# Patient Record
Sex: Male | Born: 1975 | Race: White | Hispanic: No | Marital: Married | Smoking: Never smoker
Health system: Southern US, Community
[De-identification: ages and names within clinical notes are randomized; demographics above are authoritative.]

## PROBLEM LIST (undated history)

## (undated) DIAGNOSIS — I509 Heart failure, unspecified: Secondary | ICD-10-CM

## (undated) HISTORY — PX: FRACTURE SURGERY: SHX138

---

## 2015-06-20 ENCOUNTER — Emergency Department (HOSPITAL_COMMUNITY)
Admission: EM | Admit: 2015-06-20 | Discharge: 2015-06-20 | Disposition: A | Discharge status: Home / Self Care | Payer: Self-pay | Attending: Emergency Medicine | Admitting: Emergency Medicine

## 2015-06-20 ENCOUNTER — Encounter (HOSPITAL_COMMUNITY): Payer: Self-pay | Admitting: Physical Medicine and Rehabilitation

## 2015-06-20 DIAGNOSIS — I509 Heart failure, unspecified: Secondary | ICD-10-CM | POA: Insufficient documentation

## 2015-06-20 DIAGNOSIS — K529 Noninfective gastroenteritis and colitis, unspecified: Secondary | ICD-10-CM | POA: Insufficient documentation

## 2015-06-20 DIAGNOSIS — Z880 Allergy status to penicillin: Secondary | ICD-10-CM | POA: Insufficient documentation

## 2015-06-20 HISTORY — DX: Heart failure, unspecified: I50.9

## 2015-06-20 LAB — CBC WITH DIFFERENTIAL/PLATELET
Basophils Absolute: 0 10*3/uL (ref 0.0–0.1)
Basophils Relative: 0 %
Eosinophils Absolute: 0 10*3/uL (ref 0.0–0.7)
Eosinophils Relative: 0 %
HCT: 46.7 % (ref 39.0–52.0)
Hemoglobin: 16 g/dL (ref 13.0–17.0)
Lymphocytes Relative: 7 %
Lymphs Abs: 0.6 10*3/uL — ABNORMAL LOW (ref 0.7–4.0)
MCH: 28.4 pg (ref 26.0–34.0)
MCHC: 34.3 g/dL (ref 30.0–36.0)
MCV: 82.8 fL (ref 78.0–100.0)
Monocytes Absolute: 0.3 10*3/uL (ref 0.1–1.0)
Monocytes Relative: 4 %
Neutro Abs: 7.2 10*3/uL (ref 1.7–7.7)
Neutrophils Relative %: 89 %
Platelets: 160 10*3/uL (ref 150–400)
RBC: 5.64 MIL/uL (ref 4.22–5.81)
RDW: 12.9 % (ref 11.5–15.5)
WBC: 8.1 10*3/uL (ref 4.0–10.5)

## 2015-06-20 LAB — COMPREHENSIVE METABOLIC PANEL
ALT: 58 U/L (ref 17–63)
AST: 40 U/L (ref 15–41)
Albumin: 3.9 g/dL (ref 3.5–5.0)
Alkaline Phosphatase: 105 U/L (ref 38–126)
Anion gap: 11 (ref 5–15)
BUN: 16 mg/dL (ref 6–20)
CO2: 27 mmol/L (ref 22–32)
Calcium: 9.2 mg/dL (ref 8.9–10.3)
Chloride: 103 mmol/L (ref 101–111)
Creatinine, Ser: 1.05 mg/dL (ref 0.61–1.24)
GFR calc Af Amer: >60 mL/min (ref >60)
GFR calc non Af Amer: >60 mL/min (ref >60)
Glucose, Bld: 129 mg/dL — ABNORMAL HIGH (ref 65–99)
Potassium: 4.5 mmol/L (ref 3.5–5.1)
Sodium: 141 mmol/L (ref 135–145)
Total Bilirubin: 1.2 mg/dL (ref 0.3–1.2)
Total Protein: 7 g/dL (ref 6.5–8.1)

## 2015-06-20 LAB — URINALYSIS, ROUTINE W REFLEX MICROSCOPIC
Bilirubin Urine: NEGATIVE
Glucose, UA: NEGATIVE mg/dL
Hgb urine dipstick: NEGATIVE
Ketones, ur: NEGATIVE mg/dL
Leukocytes, UA: NEGATIVE
Nitrite: NEGATIVE
Protein, ur: NEGATIVE mg/dL
Specific Gravity, Urine: 1.03 (ref 1.005–1.030)
pH: 6 (ref 5.0–8.0)

## 2015-06-20 MED ORDER — ONDANSETRON HCL 4 MG/2ML IJ SOLN
4.0000 mg | Freq: Once | INTRAMUSCULAR | Status: AC
  Administered 2015-06-20: 4 mg via INTRAVENOUS
  Filled 2015-06-20: qty 2

## 2015-06-20 MED ORDER — PROMETHAZINE HCL 25 MG PO TABS: 25.0000 mg | ORAL_TABLET | Freq: Three times a day (TID) | ORAL | Status: DC | PRN

## 2015-06-20 MED ORDER — SODIUM CHLORIDE 0.9 % IV BOLUS (SEPSIS)
1000.0000 mL | Freq: Once | INTRAVENOUS | Status: AC
  Administered 2015-06-20: 1000 mL via INTRAVENOUS

## 2015-06-20 NOTE — ED Provider Notes (Signed)
CSN: [id number]     Arrival date & time 06/20/15  0803 History   First MD Initiated Contact with Patient 06/20/15 [id number]     Chief Complaint  Patient presents with  . Emesis  . Abdominal Pain     (Consider location/radiation/quality/duration/timing/severity/associated sxs/prior Treatment) HPI Patient presents to the emergency department with nausea, vomiting, diarrhea that started last night.  Patient states that it started after he ate chicken patient states he did not take any medications prior to arrival.  Nothing seems make his condition, better or worse.  The patient states he is not have any chest pain, back pain, dysuria, shortness breath, incontinence, weakness, dizziness, blurred vision, headache, cough, fever, near syncope or syncope.  The patient states that nothing seems make his condition, better or worse Past Medical History  Diagnosis Date  . CHF (congestive heart failure) (HCC)    History reviewed. No pertinent past surgical history. No family history on file. Social History  Substance Use Topics  . Smoking status: Never Smoker   . Smokeless tobacco: None  . Alcohol Use: No    Review of Systems  All other systems negative except as documented in the HPI. All pertinent positives and negatives as reviewed in the HPI.=  Allergies  Penicillins and Tramadol  Home Medications   Prior to Admission medications   Not on File   BP 128/83 mmHg  Pulse 84  Temp(Src) 99.6 F (37.6 C) (Oral)  Resp 18  Ht 5\' 11"  (1.803 m)  Wt 90.719 kg  BMI 27.91 kg/m2  SpO2 97% Physical Exam  Constitutional: He is oriented to person, place, and time. He appears well-developed and well-nourished. No distress.  HENT:  Head: Normocephalic and atraumatic.  Mouth/Throat: Oropharynx is clear and moist.  Eyes: Pupils are equal, round, and reactive to light.  Neck: Normal range of motion. Neck supple.  Cardiovascular: Normal rate, regular rhythm and normal heart sounds.  Exam reveals no  gallop and no friction rub.   No murmur heard. Pulmonary/Chest: Effort normal and breath sounds normal. No respiratory distress. He has no wheezes.  Abdominal: Soft. Bowel sounds are normal. He exhibits no distension. There is no tenderness.  Neurological: He is alert and oriented to person, place, and time. He exhibits normal muscle tone. Coordination normal.  Skin: Skin is warm and dry. No rash noted. No erythema.  Psychiatric: He has a normal mood and affect. His behavior is normal.  Nursing note and vitals reviewed.   ED Course  Procedures (including critical care time) Labs Review Labs Reviewed  COMPREHENSIVE METABOLIC PANEL - Abnormal; Notable for the following:    Glucose, Bld 129 (*)    All other components within normal limits  CBC WITH DIFFERENTIAL/PLATELET - Abnormal; Notable for the following:    Lymphs Abs 0.6 (*)    All other components within normal limits  URINALYSIS, ROUTINE W REFLEX MICROSCOPIC (NOT AT [facility])    Imaging Review No results found. I have personally reviewed and evaluated these images and lab results as part of my medical decision-making.  Patient be discharged home with a gastroenteritis diagnosis patient is advised return here as needed.  Told to increase his fluid intake, rest as much possible.  Patient tolerated oral fluids here in the emergency department    Charlestine Nighthristopher Gillis Boardley, PA-C 06/20/15 1307  Pricilla LovelessScott Goldston, MD 06/21/15 [phone]

## 2015-06-20 NOTE — Discharge Instructions (Signed)
Return here as needed.  Increase her fluid intake.  Follow up with a primary care doctor °

## 2015-06-20 NOTE — ED Notes (Signed)
Pt presents to department via [facility] for evaluation of diffuse abdominal pain and nausea/vomiting. Onset Sunday evening after eating chicken. Pt states "I think I have food poisoning."

## 2015-07-05 ENCOUNTER — Emergency Department (HOSPITAL_COMMUNITY): Payer: Self-pay

## 2015-07-05 ENCOUNTER — Emergency Department (HOSPITAL_COMMUNITY)
Admission: EM | Admit: 2015-07-05 | Discharge: 2015-07-05 | Disposition: A | Discharge status: Home / Self Care | Payer: Self-pay | Attending: Emergency Medicine | Admitting: Emergency Medicine

## 2015-07-05 ENCOUNTER — Encounter (HOSPITAL_COMMUNITY): Payer: Self-pay

## 2015-07-05 DIAGNOSIS — Z880 Allergy status to penicillin: Secondary | ICD-10-CM | POA: Insufficient documentation

## 2015-07-05 DIAGNOSIS — R05 Cough: Secondary | ICD-10-CM

## 2015-07-05 DIAGNOSIS — J329 Chronic sinusitis, unspecified: Secondary | ICD-10-CM

## 2015-07-05 DIAGNOSIS — R059 Cough, unspecified: Secondary | ICD-10-CM

## 2015-07-05 DIAGNOSIS — J019 Acute sinusitis, unspecified: Secondary | ICD-10-CM | POA: Insufficient documentation

## 2015-07-05 DIAGNOSIS — I509 Heart failure, unspecified: Secondary | ICD-10-CM | POA: Insufficient documentation

## 2015-07-05 LAB — CBC
HEMATOCRIT: 42.7 % (ref 39.0–52.0)
HEMOGLOBIN: 14.1 g/dL (ref 13.0–17.0)
MCH: 27.5 pg (ref 26.0–34.0)
MCHC: 33 g/dL (ref 30.0–36.0)
MCV: 83.2 fL (ref 78.0–100.0)
PLATELETS: 242 10*3/uL (ref 150–400)
RBC: 5.13 MIL/uL (ref 4.22–5.81)
RDW: 13 % (ref 11.5–15.5)
WBC: 6.9 10*3/uL (ref 4.0–10.5)

## 2015-07-05 LAB — COMPREHENSIVE METABOLIC PANEL
ALT: 31 U/L (ref 17–63)
AST: 38 U/L (ref 15–41)
Albumin: 3.7 g/dL (ref 3.5–5.0)
Alkaline Phosphatase: 81 U/L (ref 38–126)
Anion gap: 7 (ref 5–15)
BUN: 9 mg/dL (ref 6–20)
CHLORIDE: 102 mmol/L (ref 101–111)
CO2: 27 mmol/L (ref 22–32)
CREATININE: 1.08 mg/dL (ref 0.61–1.24)
Calcium: 8.9 mg/dL (ref 8.9–10.3)
GFR calc Af Amer: >60 mL/min (ref >60)
GFR calc non Af Amer: >60 mL/min (ref >60)
Glucose, Bld: 122 mg/dL — ABNORMAL HIGH (ref 65–99)
POTASSIUM: 4 mmol/L (ref 3.5–5.1)
SODIUM: 136 mmol/L (ref 135–145)
Total Bilirubin: 0.4 mg/dL (ref 0.3–1.2)
Total Protein: 7.1 g/dL (ref 6.5–8.1)

## 2015-07-05 LAB — I-STAT CG4 LACTIC ACID, ED: Lactic Acid, Venous: 1.33 mmol/L (ref 0.5–2.0)

## 2015-07-05 MED ORDER — BENZONATATE 100 MG PO CAPS
100.0000 mg | ORAL_CAPSULE | Freq: Once | ORAL | Status: AC
  Administered 2015-07-05: 100 mg via ORAL
  Filled 2015-07-05: qty 1

## 2015-07-05 MED ORDER — DEXAMETHASONE 2 MG PO TABS: 10.0000 mg | ORAL_TABLET | Freq: Once | ORAL | Status: DC

## 2015-07-05 MED ORDER — BENZONATATE 100 MG PO CAPS: 100.0000 mg | ORAL_CAPSULE | Freq: Three times a day (TID) | ORAL | Status: DC | PRN

## 2015-07-05 MED ORDER — DOXYCYCLINE HYCLATE 100 MG PO CAPS: 100.0000 mg | ORAL_CAPSULE | Freq: Two times a day (BID) | ORAL | Status: DC

## 2015-07-05 MED ORDER — ONDANSETRON 4 MG PO TBDP
4.0000 mg | ORAL_TABLET | Freq: Once | ORAL | Status: AC
  Administered 2015-07-05: 4 mg via ORAL

## 2015-07-05 MED ORDER — DEXAMETHASONE 4 MG PO TABS
10.0000 mg | ORAL_TABLET | Freq: Once | ORAL | Status: AC
Start: 2015-07-05 — End: 2015-07-05
  Administered 2015-07-05: 10 mg via ORAL
  Filled 2015-07-05: qty 3

## 2015-07-05 MED ORDER — ONDANSETRON 4 MG PO TBDP
ORAL_TABLET | ORAL | Status: AC
  Filled 2015-07-05: qty 1

## 2015-07-05 NOTE — ED Notes (Signed)
Pt approached registration and stated he was vomiting and needed something for nausea.

## 2015-07-05 NOTE — ED Provider Notes (Signed)
CSN: [id number]     Arrival date & time 07/05/15  1826 History   First MD Initiated Contact with Patient 07/05/15 2241     Chief Complaint  Patient presents with  . Cough     (Consider location/radiation/quality/duration/timing/severity/associated sxs/prior Treatment) HPI Comments: 40 year old male recently released from prison presents with his significant other for concern for cough and fever. The patient reports that over the last few days he has had nasal and sinus congestion as well as a cough. He reports that the cough has been nonproductive. He reports that today was the first day he developed a fever and his significant other says that it was up to 103F. The patient reports that he lives in a group home/halfway house right now and that multiple others at the facility have had similar symptoms. The patient's significant other states that she did give him Tylenol for his fever with only mild improvement. He denies chest pain. No rash. No nausea or vomiting. No diarrhea. 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   Past Medical History  Diagnosis Date  . CHF (congestive heart failure) (HCC)    History reviewed. No pertinent past surgical history. No family history on file. Social History  Substance Use Topics  . Smoking status: Never Smoker   . Smokeless tobacco: None  . Alcohol Use: No    Review of Systems  Constitutional: Positive for fever, chills and fatigue. Negative for appetite change.  HENT: Positive for congestion, postnasal drip, rhinorrhea and sinus pressure.   Eyes: Negative for redness.  Respiratory: Positive for cough. Negative for chest tightness and shortness of breath.   Cardiovascular: Negative for chest pain, palpitations and leg swelling.  Gastrointestinal: Negative for nausea, vomiting, abdominal pain and diarrhea.  Genitourinary: Negative for dysuria, urgency and frequency.  Musculoskeletal: Positive for myalgias. Negative  for back pain.  Skin: Negative for rash.  Neurological: Negative for dizziness, weakness and headaches.  Hematological: Does not bruise/bleed easily.      Allergies  Penicillins and Tramadol  Home Medications   Prior to Admission medications   Medication Sig Start Date End Date Taking? Authorizing Provider  benzonatate (TESSALON) 100 MG capsule Take 1 capsule (100 mg total) by mouth 3 (three) times daily as needed for cough. 07/05/15   Leta Baptist, MD  dexamethasone (DECADRON) 2 MG tablet Take 5 tablets (10 mg total) by mouth once. Take as a single dose on 07/07/15 in the morning after breakfast 07/05/15   Leta Baptist, MD  doxycycline (VIBRAMYCIN) 100 MG capsule Take 1 capsule (100 mg total) by mouth 2 (two) times daily. 07/05/15   Leta Baptist, MD  promethazine (PHENERGAN) 25 MG tablet Take 1 tablet (25 mg total) by mouth every 8 (eight) hours as needed for nausea or vomiting. 06/20/15   Christopher Lawyer, PA-C   BP 130/83 mmHg  Pulse 97  Temp(Src) 99.3 F (37.4 C) (Oral)  Resp 18  Ht  (1.803 m)  Wt 195 lb (88.451 kg)  BMI 27.21 kg/m2  SpO2 98% Physical Exam  Constitutional: He is oriented to person, place, and time. He appears well-developed and well-nourished. No distress.  HENT:  Head: Normocephalic and atraumatic.  Right Ear: Tympanic membrane and external ear normal.  Left Ear: Tympanic membrane and external ear normal.  Nose: Right sinus exhibits maxillary sinus tenderness and frontal sinus tenderness. Left sinus exhibits maxillary sinus tenderness and frontal sinus tenderness.  Mouth/Throat: Oropharynx is clear and  moist. No oropharyngeal exudate, posterior oropharyngeal edema, posterior oropharyngeal erythema or tonsillar abscesses.  Postnasal drip noted over the posterior pharynx. Poor transillumination of the sinuses.  Eyes: EOM are normal. Pupils are equal, round, and reactive to light.  Neck: Normal range of motion. Neck supple.  Cardiovascular:  Normal rate, regular rhythm, normal heart sounds and intact distal pulses.   No murmur heard. Pulmonary/Chest: Effort normal. No respiratory distress. He has no wheezes. He has no rales.  Abdominal: Soft. He exhibits no distension. There is no tenderness.  Musculoskeletal: He exhibits no edema.  Neurological: He is alert and oriented to person, place, and time.  Skin: Skin is warm and dry. No rash noted. He is not diaphoretic.  Vitals reviewed.   ED Course  Procedures (including critical care time) Labs Review Labs Reviewed  COMPREHENSIVE METABOLIC PANEL - Abnormal; Notable for the following:    Glucose, Bld 122 (*)    All other components within normal limits  RAPID STREP SCREEN (NOT AT [facility])  CULTURE, GROUP A STREP [facility])  CBC  I-STAT CG4 LACTIC ACID, ED    Imaging Review No results found. I have personally reviewed and evaluated these images and lab results as part of my medical decision-making.   EKG Interpretation None      MDM  Patient was seen and evaluated in stable condition. Unremarkable respiratory examination. Laboratory studies unremarkable. Physical examination. Most consistent with viral URI versus sinusitis. Discussed at length with patient and his significant other at bedside that antibiotics are not recommended for sinusitis and less symptoms last seen 7 or greater days. Patient was given Decadron and Tessalon Perle for symptom control. Patient and significant other in agreement with plan for discharge. Patient was discharged home in stable condition with a prescription for [facility] as well as a prescription for doxycycline for sinusitis to only be filled if symptoms persist for 4 or more days after presentation today. All questions were answered prior to discharge. Final diagnoses:  Cough  Sinusitis, unspecified chronicity, unspecified location    1. Sinusitis 2. Upper respiratory viral infection    Leta Baptist, MD 07/08/15 [phone]

## 2015-07-05 NOTE — Discharge Instructions (Signed)
You were seen and evaluated today for your cough. It appears that you have a sinus infection. Likely the cough and sinus infection are viral. If your symptoms persist for another 3 days he can fill the antibiotic to treat the sinus infection. Use the cough medicine as prescribed. Take the second dose of steroid not tomorrow morning but the next morning after breakfast. Follow up outpatient with the clinic provided to establish primary care and for follow-up.  Cough, Adult Coughing is a reflex that clears your throat and your airways. Coughing helps to heal and protect your lungs. It is normal to cough occasionally, but a cough that happens with other symptoms or lasts a long time may be a sign of a condition that needs treatment. A cough may last only 2-3 weeks (acute), or it may last longer than 8 weeks (chronic). CAUSES Coughing is commonly caused by:  Breathing in substances that irritate your lungs.  A viral or bacterial respiratory infection.  Allergies.  Asthma.  Postnasal drip.  Smoking.  Acid backing up from the stomach into the esophagus (gastroesophageal reflux).  Certain medicines.  Chronic lung problems, including COPD (or rarely, lung cancer).  Other medical conditions such as heart failure. HOME CARE INSTRUCTIONS  Pay attention to any changes in your symptoms. Take these actions to help with your discomfort:  Take medicines only as told by your health care provider.  If you were prescribed an antibiotic medicine, take it as told by your health care provider. Do not stop taking the antibiotic even if you start to feel better.  Talk with your health care provider before you take a cough suppressant medicine.  Drink enough fluid to keep your urine clear or pale yellow.  If the air is dry, use a cold steam vaporizer or humidifier in your bedroom or your home to help loosen secretions.  Avoid anything that causes you to cough at work or at home.  If your cough is  worse at night, try sleeping in a semi-upright position.  Avoid cigarette smoke. If you smoke, quit smoking. If you need help quitting, ask your health care provider.  Avoid caffeine.  Avoid alcohol.  Rest as needed. SEEK MEDICAL CARE IF:   You have new symptoms.  You cough up pus.  Your cough does not get better after 2-3 weeks, or your cough gets worse.  You cannot control your cough with suppressant medicines and you are losing sleep.  You develop pain that is getting worse or pain that is not controlled with pain medicines.  You have a fever.  You have unexplained weight loss.  You have night sweats. SEEK IMMEDIATE MEDICAL CARE IF:  You cough up blood.  You have difficulty breathing.  Your heartbeat is very fast.   This information is not intended to replace advice given to you by your health care provider. Make sure you discuss any questions you have with your health care provider.   Document Released: 12/01/2010 Document Revised: 02/23/2015 Document Reviewed: 08/11/2014 Elsevier Interactive Patient Education 2016 Else[employer] Inc[employer]  Sinusitis, Adult Sinusitis is redness, soreness, and inflammation of the paranasal sinuses. Paranasal sinuses are air pockets within the bones of your face. They are located beneath your eyes, in the middle of your forehead, and above your eyes. In healthy paranasal sinuses, mucus is able to drain out, and air is able to circulate through them by way of your nose. However, when your paranasal sinuses are inflamed, mucus and air can become trapped. This  can allow bacteria and other germs to grow and cause infection. Sinusitis can develop quickly and last only a short time (acute) or continue over a long period (chronic). Sinusitis that lasts for more than 12 weeks is considered chronic. CAUSES Causes of sinusitis include:  Allergies.  Structural abnormalities, such as displacement of the cartilage that separates your nostrils (deviated  septum), which can decrease the air flow through your nose and sinuses and affect sinus drainage.  Functional abnormalities, such as when the small hairs (cilia) that line your sinuses and help remove mucus do not work properly or are not present. SIGNS AND SYMPTOMS Symptoms of acute and chronic sinusitis are the same. The primary symptoms are pain and pressure around the affected sinuses. Other symptoms include:  Upper toothache.  Earache.  Headache.  Bad breath.  Decreased sense of smell and taste.  A cough, which worsens when you are lying flat.  Fatigue.  Fever.  Thick drainage from your nose, which often is green and may contain pus (purulent).  Swelling and warmth over the affected sinuses. DIAGNOSIS Your health care provider will perform a physical exam. During your exam, your health care provider may perform any of the following to help determine if you have acute sinusitis or chronic sinusitis:  Look in your nose for signs of abnormal growths in your nostrils (nasal polyps).  Tap over the affected sinus to check for signs of infection.  View the inside of your sinuses using an imaging device that has a light attached (endoscope). If your health care provider suspects that you have chronic sinusitis, one or more of the following tests may be recommended:  Allergy tests.  Nasal culture. A sample of mucus is taken from your nose, sent to a lab, and screened for bacteria.  Nasal cytology. A sample of mucus is taken from your nose and examined by your health care provider to determine if your sinusitis is related to an allergy. TREATMENT Most cases of acute sinusitis are related to a viral infection and will resolve on their own within 10 days. Sometimes, medicines are prescribed to help relieve symptoms of both acute and chronic sinusitis. These may include pain medicines, decongestants, nasal steroid sprays, or saline sprays. However, for sinusitis related to a  bacterial infection, your health care provider will prescribe antibiotic medicines. These are medicines that will help kill the bacteria causing the infection. Rarely, sinusitis is caused by a fungal infection. In these cases, your health care provider will prescribe antifungal medicine. For some cases of chronic sinusitis, surgery is needed. Generally, these are cases in which sinusitis recurs more than 3 times per year, despite other treatments. HOME CARE INSTRUCTIONS  Drink plenty of water. Water helps thin the mucus so your sinuses can drain more easily.  Use a humidifier.  Inhale steam 3-4 times a day (for example, sit in the bathroom with the shower running).  Apply a warm, moist washcloth to your face 3-4 times a day, or as directed by your health care provider.  Use saline nasal sprays to help moisten and clean your sinuses.  Take medicines only as directed by your health care provider.  If you were prescribed either an antibiotic or antifungal medicine, finish it all even if you start to feel better. SEEK IMMEDIATE MEDICAL CARE IF:  You have increasing pain or severe headaches.  You have nausea, vomiting, or drowsiness.  You have swelling around your face.  You have vision problems.  You have a stiff neck.  You have difficulty breathing.   This information is not intended to replace advice given to you by your health care provider. Make sure you discuss any questions you have with your health care provider.   Document Released: 06/04/2005 Document Revised: 06/25/2014 Document Reviewed: 06/19/2011 Elsevier Interactive Patient Education [employer]

## 2015-07-05 NOTE — ED Notes (Signed)
Pt left with all his belongings and ambulated out of the treatment area.  

## 2015-07-05 NOTE — ED Notes (Signed)
Pt brought in via [facility] for dry cough, fever of 103 and chills; onset 3 days ago. Wife reports he took tylenol today and fever decreased to 102. She also gave him multiple OTC meds. Temp with EMS 99 and temp here 99.3. Wife states she called EMS because his cough is not going away.

## 2015-07-06 LAB — RAPID STREP SCREEN (MED CTR MEBANE ONLY): Streptococcus, Group A Screen (Direct): NEGATIVE

## 2015-07-08 LAB — CULTURE, GROUP A STREP (THRC)

## 2015-08-03 ENCOUNTER — Emergency Department (HOSPITAL_COMMUNITY)
Admission: EM | Admit: 2015-08-03 | Discharge: 2015-08-03 | Disposition: A | Discharge status: Home / Self Care | Payer: Self-pay | Attending: Emergency Medicine | Admitting: Emergency Medicine

## 2015-08-03 ENCOUNTER — Encounter (HOSPITAL_COMMUNITY): Payer: Self-pay | Admitting: *Deleted

## 2015-08-03 DIAGNOSIS — R590 Localized enlarged lymph nodes: Secondary | ICD-10-CM | POA: Insufficient documentation

## 2015-08-03 DIAGNOSIS — K029 Dental caries, unspecified: Secondary | ICD-10-CM

## 2015-08-03 DIAGNOSIS — Z792 Long term (current) use of antibiotics: Secondary | ICD-10-CM | POA: Insufficient documentation

## 2015-08-03 DIAGNOSIS — Z880 Allergy status to penicillin: Secondary | ICD-10-CM | POA: Insufficient documentation

## 2015-08-03 DIAGNOSIS — I509 Heart failure, unspecified: Secondary | ICD-10-CM | POA: Insufficient documentation

## 2015-08-03 MED ORDER — CLINDAMYCIN HCL 150 MG PO CAPS: 150.0000 mg | ORAL_CAPSULE | Freq: Four times a day (QID) | ORAL | Status: DC

## 2015-08-03 MED ORDER — CLINDAMYCIN HCL 150 MG PO CAPS
300.0000 mg | ORAL_CAPSULE | Freq: Once | ORAL | Status: AC
  Administered 2015-08-03: 300 mg via ORAL
  Filled 2015-08-03: qty 2

## 2015-08-03 MED ORDER — IBUPROFEN 400 MG PO TABS
800.0000 mg | ORAL_TABLET | Freq: Once | ORAL | Status: AC
  Administered 2015-08-03: 800 mg via ORAL
  Filled 2015-08-03: qty 2

## 2015-08-03 MED ORDER — IBUPROFEN 800 MG PO TABS: 800.0000 mg | ORAL_TABLET | Freq: Three times a day (TID) | ORAL | Status: DC

## 2015-08-03 NOTE — Discharge Instructions (Signed)
Please call and follow up with a dentist in the next 2 days for further management of your dental pain  Dental Caries Dental caries (also called tooth decay) is the most common oral disease. It can occur at any age but is more common in children and young adults.  HOW DENTAL CARIES DEVELOPS  The process of decay begins when bacteria and foods (particularly sugars and starches) combine in your mouth to produce plaque. Plaque is a substance that sticks to the hard, outer surface of a tooth (enamel). The bacteria in plaque produce acids that attack enamel. These acids may also attack the root surface of a tooth (cementum) if it is exposed. Repeated attacks dissolve these surfaces and create holes in the tooth (cavities). If left untreated, the acids destroy the other layers of the tooth.  RISK FACTORS  Frequent sipping of sugary beverages.   Frequent snacking on sugary and starchy foods, especially those that easily get stuck in the teeth.   Poor oral hygiene.   Dry mouth.   Substance abuse such as methamphetamine abuse.   Broken or poor-fitting dental restorations.   Eating disorders.   Gastroesophageal reflux disease (GERD).   Certain radiation treatments to the head and neck. SYMPTOMS In the early stages of dental caries, symptoms are seldom present. Sometimes white, chalky areas may be seen on the enamel or other tooth layers. In later stages, symptoms may include:  Pits and holes on the enamel.  Toothache after sweet, hot, or cold foods or drinks are consumed.  Pain around the tooth.  Swelling around the tooth. DIAGNOSIS  Most of the time, dental caries is detected during a regular dental checkup. A diagnosis is made after a thorough medical and dental history is taken and the surfaces of your teeth are checked for signs of dental caries. Sometimes special instruments, such as lasers, are used to check for dental caries. Dental X-ray exams may be taken so that areas not  visible to the eye (such as between the contact areas of the teeth) can be checked for cavities.  TREATMENT  If dental caries is in its early stages, it may be reversed with a fluoride treatment or an application of a remineralizing agent at the dental office. Thorough brushing and flossing at home is needed to aid these treatments. If it is in its later stages, treatment depends on the location and extent of tooth destruction:   If a small area of the tooth has been destroyed, the destroyed area will be removed and cavities will be filled with a material such as gold, silver amalgam, or composite resin.   If a large area of the tooth has been destroyed, the destroyed area will be removed and a cap (crown) will be fitted over the remaining tooth structure.   If the center part of the tooth (pulp) is affected, a procedure called a root canal will be needed before a filling or crown can be placed.   If most of the tooth has been destroyed, the tooth may need to be pulled (extracted). HOME CARE INSTRUCTIONS You can prevent, stop, or reverse dental caries at home by practicing good oral hygiene. Good oral hygiene includes:  Thoroughly cleaning your teeth at least twice a day with a toothbrush and dental floss.   Using a fluoride toothpaste. A fluoride mouth rinse may also be used if recommended by your dentist or health care provider.   Restricting the amount of sugary and starchy foods and sugary liquids you  consume.   Avoiding frequent snacking on these foods and sipping of these liquids.   Keeping regular visits with a dentist for checkups and cleanings. PREVENTION   Practice good oral hygiene.  Consider a dental sealant. A dental sealant is a coating material that is applied by your dentist to the pits and grooves of teeth. The sealant prevents food from being trapped in them. It may protect the teeth for several years.  Ask about fluoride supplements if you live in a community  without fluorinated water or with water that has a low fluoride content. Use fluoride supplements as directed by your dentist or health care provider.  Allow fluoride varnish applications to teeth if directed by your dentist or health care provider.   This information is not intended to replace advice given to you by your health care provider. Make sure you discuss any questions you have with your health care provider.   Document Released: 02/24/2002 Document Revised: 06/25/2014 Document Reviewed: 06/06/2012 Elsevier Interactive Patient Education [employer].

## 2015-08-03 NOTE — ED Notes (Signed)
Declined W/C at D/C and was escorted to lobby by RN. 

## 2015-08-03 NOTE — ED Provider Notes (Signed)
CSN: [id number]     Arrival date & time 08/03/15  1812 History  By signing my name below, I, Martin Lin, attest that this documentation has been prepared under the direction and in the presence of [name], PA-C Electronically Signed: Soijett Lin, ED Scribe. 08/03/2015. 6:31 PM.   Chief Complaint  Patient presents with  . Dental Pain      The history is provided by the patient. No language interpreter was used.    Martin Lin is a 40 y.o. male  who presents to the Emergency Department complaining of constant, throbbing, right sided upper back dental pain onset last night worsening today. He notes that he has three teeth that have holes in them and he doesn't have a dentist at this time. Pt is working on obtaining his orange card at this time. He states that his dental pain is worsened with temperature change and there are no alleviating factors. He reports that he has had trouble sleeping due to the dental pain. He states that has not tried any medications for the relief for his symptoms. He denies fever and any other symptoms. Pt is allergic to penicillin and he is unsure of his reaction. Pt is allergic to tramadol to which he has red spots.     Past Medical History  Diagnosis Date  . CHF (congestive heart failure) (HCC)    No past surgical history on file. No family history on file. Social History  Substance Use Topics  . Smoking status: Never Smoker   . Smokeless tobacco: Not on file  . Alcohol Use: No    Review of Systems  Constitutional: Negative for fever and chills.  HENT: Positive for dental problem. Negative for facial swelling and trouble swallowing.       Allergies  Penicillins and Tramadol  Home Medications   Prior to Admission medications   Medication Sig Start Date End Date Taking? Authorizing Provider  benzonatate (TESSALON) 100 MG capsule Take 1 capsule (100 mg total) by mouth 3 (three) times daily as needed for cough. 07/05/15   Leta Baptist, MD   dexamethasone (DECADRON) 2 MG tablet Take 5 tablets (10 mg total) by mouth once. Take as a single dose on 07/07/15 in the morning after breakfast 07/05/15   Leta Baptist, MD  doxycycline (VIBRAMYCIN) 100 MG capsule Take 1 capsule (100 mg total) by mouth 2 (two) times daily. 07/05/15   Leta Baptist, MD  promethazine (PHENERGAN) 25 MG tablet Take 1 tablet (25 mg total) by mouth every 8 (eight) hours as needed for nausea or vomiting. 06/20/15   Charlestine Night, PA-C   There were no vitals taken for this visit. Physical Exam Physical Exam  Constitutional: Pt appears well-developed and well-nourished.  HENT:  Head: Normocephalic.  Right Ear: Tympanic membrane, external ear and ear canal normal.  Left Ear: Tympanic membrane, external ear and ear canal normal.  Nose: Nose normal. Right sinus exhibits no maxillary sinus tenderness and no frontal sinus tenderness. Left sinus exhibits no maxillary sinus tenderness and no frontal sinus tenderness.  Mouth/Throat: Uvula is midline, oropharynx is clear and moist and mucous membranes are normal. No oral lesions. Abnormal dentition. Significant dental decay noted to tooth #2, #3, #7, #8, and #9  Right upper gum line. Mild erythema. No abscess noted. Dental caries present. No uvula swelling or lacerations. No oropharyngeal exudate, posterior oropharyngeal edema, posterior oropharyngeal erythema or tonsillar abscesses.  No gingival swelling, fluctuance or induration No gross abscess  Eyes: Conjunctivae  are normal. Pupils are equal, round, and reactive to light. Right eye exhibits no discharge. Left eye exhibits no discharge.  Neck: Normal range of motion. Neck supple.  No stridor Handling secretions without difficulty No nuchal rigidity Anterior cervical lymphadenopathy that is TTP   Cardiovascular: Normal rate, regular rhythm and normal heart sounds.   Pulmonary/Chest: Effort normal. No respiratory distress.  Equal chest rise  Abdominal: Soft.  Bowel sounds are normal. Pt exhibits no distension. There is no tenderness.  Lymphadenopathy:    Pt has no cervical adenopathy.  Neurological: Pt is alert.  Skin: Skin is warm and dry.  Psychiatric: Pt has a normal mood and affect.  Nursing note and vitals reviewed.   ED Course  Procedures (including critical care time) DIAGNOSTIC STUDIES: Oxygen Saturation is 99% on RA, nl by my interpretation.    COORDINATION OF CARE: 6:28 PM Discussed treatment plan with pt at bedside which includes ibuprofen, abx Rx, anti-inflammatory Rx and pt agreed to plan.   MDM   Final diagnoses:  Pain due to dental caries    Patient with dentalgia.  No abscess requiring immediate incision and drainage.  Exam not concerning for Ludwig's angina or pharyngeal abscess.  Will treat with ibuprofen Rx and abx Rx. Pt instructed to follow-up with dentist.  Discussed return precautions. Pt safe for discharge.  I personally performed the services described in this documentation, which was scribed in my presence. The recorded information has been reviewed and is accurate.   BP 136/74 mmHg  Pulse 81  Temp(Src) 98.2 F (36.8 C) (Oral)  Resp 18  Ht  (1.803 m)  Wt 90.719 kg  BMI 27.91 kg/m2  SpO2 99%    [name], PA-C 08/04/15 0129  Glynn Octave, MD 08/04/15 [phone]

## 2015-08-03 NOTE — ED Notes (Signed)
Pt reports dental pain started this AM

## 2015-09-01 ENCOUNTER — Emergency Department (HOSPITAL_COMMUNITY)
Admission: EM | Admit: 2015-09-01 | Discharge: 2015-09-02 | Disposition: A | Discharge status: Home / Self Care | Payer: Self-pay | Attending: Emergency Medicine | Admitting: Emergency Medicine

## 2015-09-01 ENCOUNTER — Encounter (HOSPITAL_COMMUNITY): Payer: Self-pay

## 2015-09-01 DIAGNOSIS — R443 Hallucinations, unspecified: Secondary | ICD-10-CM

## 2015-09-01 DIAGNOSIS — I509 Heart failure, unspecified: Secondary | ICD-10-CM | POA: Insufficient documentation

## 2015-09-01 DIAGNOSIS — R441 Visual hallucinations: Secondary | ICD-10-CM | POA: Insufficient documentation

## 2015-09-01 DIAGNOSIS — Z880 Allergy status to penicillin: Secondary | ICD-10-CM | POA: Insufficient documentation

## 2015-09-01 LAB — COMPREHENSIVE METABOLIC PANEL
ALT: 57 U/L (ref 17–63)
AST: 55 U/L — ABNORMAL HIGH (ref 15–41)
Albumin: 4 g/dL (ref 3.5–5.0)
Alkaline Phosphatase: 81 U/L (ref 38–126)
Anion gap: 9 (ref 5–15)
BILIRUBIN TOTAL: 0.4 mg/dL (ref 0.3–1.2)
BUN: 7 mg/dL (ref 6–20)
CHLORIDE: 106 mmol/L (ref 101–111)
CO2: 26 mmol/L (ref 22–32)
CREATININE: 0.99 mg/dL (ref 0.61–1.24)
Calcium: 9.5 mg/dL (ref 8.9–10.3)
GFR calc Af Amer: >60 mL/min (ref >60)
GLUCOSE: 87 mg/dL (ref 65–99)
Potassium: 4.3 mmol/L (ref 3.5–5.1)
Sodium: 141 mmol/L (ref 135–145)
Total Protein: 7 g/dL (ref 6.5–8.1)

## 2015-09-01 LAB — CBC
HEMATOCRIT: 44.4 % (ref 39.0–52.0)
HEMOGLOBIN: 14.5 g/dL (ref 13.0–17.0)
MCH: 27.7 pg (ref 26.0–34.0)
MCHC: 32.7 g/dL (ref 30.0–36.0)
MCV: 84.7 fL (ref 78.0–100.0)
Platelets: 257 10*3/uL (ref 150–400)
RBC: 5.24 MIL/uL (ref 4.22–5.81)
RDW: 13.5 % (ref 11.5–15.5)
WBC: 8.1 10*3/uL (ref 4.0–10.5)

## 2015-09-01 LAB — RAPID URINE DRUG SCREEN, HOSP PERFORMED
Amphetamines: NOT DETECTED
BARBITURATES: NOT DETECTED
Benzodiazepines: NOT DETECTED
COCAINE: NOT DETECTED
Opiates: NOT DETECTED
TETRAHYDROCANNABINOL: NOT DETECTED

## 2015-09-01 LAB — ETHANOL

## 2015-09-01 NOTE — ED Provider Notes (Signed)
CSN: [id number]     Arrival date & time 09/01/15  1650 History   First MD Initiated Contact with Patient 09/01/15 2140     Chief Complaint  Patient presents with  . Hallucinations      HPI  40 y.o. male presenting with chief complaint of visual hallucinations. Patient is accompanied by his fiance who corroborates his story. The patient reports that he worked double shifts last week and as a consequence did not sleep for over 4 days straight. About 3 days into not sleeping, patient reports "seeing people out of the corner of my eye in the shadows." Denies hearing voices. Denies suicidal ideation, homicidal ideation, history of depression, prior psychiatric diagnoses, or psychotropic medications. He denies drug or alcohol use. Denies recent head trauma. Denies recent illnesses including headache, fever, neck stiffness, chest pain, shortness of breath, abdominal pain, nausea, vomiting. He reports that after sleeping well 2 nights in a row his symptoms have completely resolved.   Past Medical History  Diagnosis Date  . CHF (congestive heart failure) (HCC)    History reviewed. No pertinent past surgical history. No family history on file. Social History  Substance Use Topics  . Smoking status: Never Smoker   . Smokeless tobacco: None  . Alcohol Use: No    Review of Systems  Constitutional: Negative for fever, chills, activity change and appetite change.  HENT: Negative for congestion, facial swelling, rhinorrhea and sore throat.   Eyes: Negative for visual disturbance.  Respiratory: Negative for cough, shortness of breath and wheezing.   Cardiovascular: Negative for chest pain, palpitations and leg swelling.  Gastrointestinal: Negative for nausea, vomiting, abdominal pain, diarrhea, constipation and blood in stool.  Genitourinary: Negative for dysuria, frequency, hematuria, flank pain and difficulty urinating.  Musculoskeletal: Negative for myalgias, back pain, joint swelling,  arthralgias, neck pain and neck stiffness.  Skin: Negative for rash.  Neurological: Negative for dizziness, weakness, light-headedness and headaches.  Psychiatric/Behavioral: Positive for hallucinations (resolved) and decreased concentration (resolved). Negative for suicidal ideas, behavioral problems, confusion and agitation.      Allergies  Penicillins and Tramadol  Home Medications   Prior to Admission medications   Medication Sig Start Date End Date Taking? Authorizing Provider  benzonatate (TESSALON) 100 MG capsule Take 1 capsule (100 mg total) by mouth 3 (three) times daily as needed for cough. Patient not taking: Reported on 09/01/2015 07/05/15   Leta Baptist, MD  clindamycin (CLEOCIN) 150 MG capsule Take 1 capsule (150 mg total) by mouth every 6 (six) hours. Patient not taking: Reported on 09/01/2015 08/03/15   [name], PA-C  dexamethasone (DECADRON) 2 MG tablet Take 5 tablets (10 mg total) by mouth once. Take as a single dose on 07/07/15 in the morning after breakfast Patient not taking: Reported on 09/01/2015 07/05/15   Leta Baptist, MD  doxycycline (VIBRAMYCIN) 100 MG capsule Take 1 capsule (100 mg total) by mouth 2 (two) times daily. Patient not taking: Reported on 09/01/2015 07/05/15   Leta Baptist, MD  ibuprofen (ADVIL,MOTRIN) 800 MG tablet Take 1 tablet (800 mg total) by mouth 3 (three) times daily. Patient not taking: Reported on 09/01/2015 08/03/15   [name], PA-C  promethazine (PHENERGAN) 25 MG tablet Take 1 tablet (25 mg total) by mouth every 8 (eight) hours as needed for nausea or vomiting. Patient not taking: Reported on 09/01/2015 06/20/15   Charlestine Night, PA-C   BP 136/75 mmHg  Pulse 66  Temp(Src) 98.3 F (36.8 C) (Oral)  Resp 19  SpO2 98% Physical Exam  Constitutional: He is oriented to person, place, and time. He appears well-developed and well-nourished. No distress.  HENT:  Head: Normocephalic and atraumatic.  Right Ear: External ear normal.   Left Ear: External ear normal.  Nose: Nose normal.  Mouth/Throat: Oropharynx is clear and moist. No oropharyngeal exudate.  Eyes: Conjunctivae are normal. Pupils are equal, round, and reactive to light. Right eye exhibits no discharge. Left eye exhibits no discharge. No scleral icterus.  Neck: Normal range of motion. Neck supple. No tracheal deviation present.  Cardiovascular: Normal rate, regular rhythm and normal heart sounds.  Exam reveals no gallop and no friction rub.   No murmur heard. Pulmonary/Chest: Effort normal and breath sounds normal. No respiratory distress. He has no wheezes. He has no rales.  Abdominal: Soft. Bowel sounds are normal. He exhibits no distension and no mass. There is no tenderness. There is no rebound and no guarding.  Musculoskeletal: Normal range of motion. He exhibits no edema or tenderness.  Neurological: He is alert and oriented to person, place, and time. He exhibits normal muscle tone.  5/5 strength in the upper and lower extremities bilaterally, with intact sensation to light touch.   Skin: Skin is warm and dry. No rash noted. He is not diaphoretic.  Psychiatric: He has a normal mood and affect. His behavior is normal. Judgment and thought content normal.    ED Course  Procedures (including critical care time) Labs Review Labs Reviewed  COMPREHENSIVE METABOLIC PANEL - Abnormal; Notable for the following:    AST 55 (*)    All other components within normal limits  ETHANOL  CBC  URINE RAPID DRUG SCREEN, HOSP PERFORMED    Imaging Review No results found. I have personally reviewed and evaluated these images and lab results as part of my medical decision-making.   EKG Interpretation None      MDM   Final diagnoses:  Hallucinations    Patient is generally well-appearing. Alert and oriented 4. Appropriate behavior and mood. He reports that his hallucinations have completely resolved after sleeping. Doubt organic etiology for his symptoms  as he denies infectious symptoms and there is no history of recent head trauma. Doubt new onset psychiatric diagnosis as sleep deprivation was a clear preceeding trigger for his symptoms. Patient continues to deny suicidal or homicidal ideation. He is able to contract for safety and is not a danger to himself or others. I counseled him to not skip sleep and to return to the emergency department immediately for any suicidal or homicidal ideation. Furthermore, he should follow up here for recurrence of his symptoms in the absence of sleep deprivation. He expressed agreement and understanding with this plan and will follow up as appropriate.    Jenifer Ernestina PennaBrunno Irick, MD 09/02/15 [id number]  Azalia BilisKevin Campos, MD 09/02/15 [phone]

## 2015-09-01 NOTE — ED Notes (Signed)
Pt here requesting a mental health evaluation because he was having visual hallucinations on Saturday, 'I was seeing people I knew didn't exist." He denies SI/HI or current hallucinations but states 'my mind is just not right and I want to see what is wrong." pt also denies illegal drug or alcohol use.

## 2015-09-23 ENCOUNTER — Ambulatory Visit (HOSPITAL_COMMUNITY)
Admission: EM | Admit: 2015-09-23 | Discharge: 2015-09-23 | Disposition: A | Discharge status: Home / Self Care | Payer: No Typology Code available for payment source | Attending: Emergency Medicine | Admitting: Emergency Medicine

## 2015-09-23 ENCOUNTER — Encounter (HOSPITAL_COMMUNITY): Payer: Self-pay | Admitting: Emergency Medicine

## 2015-09-23 DIAGNOSIS — R1110 Vomiting, unspecified: Secondary | ICD-10-CM

## 2015-09-23 DIAGNOSIS — R197 Diarrhea, unspecified: Secondary | ICD-10-CM

## 2015-09-23 MED ORDER — ONDANSETRON 4 MG PO TBDP
8.0000 mg | ORAL_TABLET | Freq: Once | ORAL | Status: AC
  Administered 2015-09-23: 8 mg via ORAL

## 2015-09-23 MED ORDER — ONDANSETRON 4 MG PO TBDP
ORAL_TABLET | ORAL | Status: AC
  Filled 2015-09-23: qty 2

## 2015-09-23 MED ORDER — ONDANSETRON HCL 4 MG PO TABS: ORAL_TABLET | ORAL | Status: DC

## 2015-09-23 NOTE — ED Provider Notes (Signed)
HPI  SUBJECTIVE:  Martin Lin is a 40 y.o. male who presents with multiple episodes of nonbilious, nonbloody vomiting, and watery, nonbloody diarrhea starting last night at 2000. Estimates that he has had "over 20" episodes of vomiting and diarrhea. Reports crampy abdominal pain before vomiting and diarrhea, which then resolves. He has no other abdominal pain. He states that he is unable to tolerate by mouth. He has tried Alka-Seltzer, Pepto-Bismol, symptoms are better with Alka-Seltzer, no aggravating factors. He denies nausea, headache, chest pain, shortness of breath. No other abdominal pain, abdominal distention. No anorexia. Patient states that he is urinating normally. No dizziness, syncope. He states that he feels somewhat weak. Patient has no urinary complaints. He denies taking any medications whatsoever. He states he had identical symptoms with food poisoning several months ago. He denies any raw or undercooked foods, no known sick contacts. No smoking, drinking, illegal drugs, diabetes, hypertension, abdominal surgeries, gallbladder disease, pancreatitis kidney disease. Has past medical history of CHF. PMD: None.     Past Medical History  Diagnosis Date  . CHF (congestive heart failure) [facility])     Past Surgical History  Procedure Laterality Date  . Fracture surgery      History reviewed. No pertinent family history.  Social History  Substance Use Topics  . Smoking status: Never Smoker   . Smokeless tobacco: None  . Alcohol Use: No    No current facility-administered medications for this encounter.  Current outpatient prescriptions:  .  ondansetron (ZOFRAN) 4 MG tablet, Take 1-2 tablets 3 times a day as needed for nausea, vomiting, Disp: 30 tablet, Rfl: 0 .  [DISCONTINUED] promethazine (PHENERGAN) 25 MG tablet, Take 1 tablet (25 mg total) by mouth every 8 (eight) hours as needed for nausea or vomiting. (Patient not taking: Reported on 09/01/2015), Disp: 15 tablet, Rfl:  0  Allergies  Allergen Reactions  . Penicillins Other (See Comments)    Childhood Has patient had a PCN reaction causing immediate rash, facial/tongue/throat swelling, SOB or lightheadedness with hypotension: YES Has patient had a PCN reaction causing severe rash involving mucus membranes or skin necrosis: NO Has patient had a PCN reaction that required hospitalization NO Has patient had a PCN reaction occurring within the last 10 years: NO If all of the above answers are "NO", then may proceed with Cephalosporin use.   . Tramadol Rash     ROS  As noted in HPI.   Physical Exam  BP 134/77 mmHg  Pulse 73  Temp(Src) 99 F (37.2 C) (Oral)  Resp 16  Ht  (1.803 m)  Wt 200 lb (90.719 kg)  BMI 27.91 kg/m2  SpO2 100%  Orthostatic VS for the past 24 hrs:  BP- Lying Pulse- Lying BP- Sitting Pulse- Sitting BP- Standing at 0 minutes Pulse- Standing at 0 minutes  09/23/15 1702 144/75 mmHg 65 121/69 mmHg 76 120/79 mmHg 80      Constitutional: Well developed, well nourished, no acute distress Eyes: PERRL, EOMI, conjunctiva normal bilaterally HENT: Normocephalic, atraumatic,mucus membranes moist Respiratory: Clear to auscultation bilaterally, no rales, no wheezing, no rhonchi Cardiovascular: Normal rate and rhythm, no murmurs, no gallops, no rubs. Refill less than 2 seconds. GI: Soft, nondistended, normal bowel sounds, nontender, no rebound, no guarding skin: skin intact. Good skin turgor Musculoskeletal: No edema, no tenderness, no deformities Neurologic: Alert & oriented x 3, CN II-XII grossly intact, no motor deficits, sensation grossly intact Psychiatric: Speech and behavior appropriate  ED Course   Medications  ondansetron (ZOFRAN-ODT) disintegrating  tablet 8 mg (8 mg Oral Given 09/23/15 1700)    Orders Placed This Encounter  Procedures  . Orthostatic vital signs    Standing Status: Standing     Number of Occurrences: 1     Standing Expiration Date:    No  results found for this or any previous visit (from the past 24 hour(s)). No results found.  ED Clinical Impression  Vomiting and diarrhea  ED Assessment/Plan  Patient is mildly orthostatic, but was asymptomatic when orthostatics were performed. He appears well-hydrated. Refill is less than 2 seconds. Abdomen is benign. Doubt surgical abdomen. Patient has no history of kidney disease, and is urinating normally per patient. We will attempt by mouth rehydration. Deferring labs today.   On reEvaluation, patient's tolerating by mouth. He has no complaints. Home with Zofran, Pedialyte, push fluids, bland diet as tolerated. Will provide primary care referral.   Discussed MDM, plan and followup with patient  Discussed sn/sx that should prompt return to the  ED. Patient  agrees with plan.  *This clinic note was created using Dragon dictation software. Therefore, there may be occasional mistakes despite careful proofreading.  ?  Domenick GongAshley Gerald Kuehl, MD 09/23/15 2156

## 2015-09-23 NOTE — ED Notes (Signed)
MD at bedside. 

## 2015-09-23 NOTE — ED Notes (Signed)
PT denies dizziness with position changes.

## 2015-09-23 NOTE — ED Notes (Signed)
PT reports vomiting and diarrhea that started at 9pm. PT reports "probably 20" episodes of vomiting in the last 24 hours. PT suspects food poisoning. PT last vomited at 3pm. PT reports crampy abdominal pain "that's probably from all the vomiting."

## 2015-09-23 NOTE — ED Notes (Signed)
Pt has tolerated 8oz of PO fluid. PT reports he feels ready to go home.

## 2015-09-23 NOTE — Discharge Instructions (Signed)
You may take 1-2 tabs of Zofran 3 times a day. Start with 2 tabs of Zofran 3 times a day for the first few days. Liquid diet, then progressed to bland diet as tolerated. Go to the ER for the signs and symptoms we discussed  [facility]: [address] [place] [id number]  [phone]  [facility]: [address] [place] [id number]   [phone]  [facility]: [address] [place] [id number]  [phone]   primary care : [address] [place] [id number] [phone]  [facility]: [address] [place] [phone]  [facility]: [address] [place] [id number] [phone]  Dr. Oneal GroutMahima Pandey [address] [facility] [address] [facility] [place] [id number]  [phone]  Dr. Jackie PlumGeorge Osei-Bonsu, [facility]. [address] [place] [address]  [phone]  [facility] [address], [address], [place] [phone]

## 2015-10-14 ENCOUNTER — Emergency Department (HOSPITAL_COMMUNITY)
Admission: EM | Admit: 2015-10-14 | Discharge: 2015-10-14 | Disposition: A | Discharge status: Home / Self Care | Payer: No Typology Code available for payment source | Attending: Emergency Medicine | Admitting: Emergency Medicine

## 2015-10-14 ENCOUNTER — Emergency Department (HOSPITAL_COMMUNITY): Payer: No Typology Code available for payment source

## 2015-10-14 ENCOUNTER — Encounter (HOSPITAL_COMMUNITY): Payer: Self-pay | Admitting: Emergency Medicine

## 2015-10-14 DIAGNOSIS — W1789XA Other fall from one level to another, initial encounter: Secondary | ICD-10-CM | POA: Insufficient documentation

## 2015-10-14 DIAGNOSIS — S99921A Unspecified injury of right foot, initial encounter: Secondary | ICD-10-CM | POA: Insufficient documentation

## 2015-10-14 DIAGNOSIS — Y998 Other external cause status: Secondary | ICD-10-CM | POA: Insufficient documentation

## 2015-10-14 DIAGNOSIS — Y9289 Other specified places as the place of occurrence of the external cause: Secondary | ICD-10-CM | POA: Insufficient documentation

## 2015-10-14 DIAGNOSIS — Y9389 Activity, other specified: Secondary | ICD-10-CM | POA: Insufficient documentation

## 2015-10-14 DIAGNOSIS — S9032XA Contusion of left foot, initial encounter: Secondary | ICD-10-CM | POA: Insufficient documentation

## 2015-10-14 DIAGNOSIS — Z880 Allergy status to penicillin: Secondary | ICD-10-CM | POA: Insufficient documentation

## 2015-10-14 DIAGNOSIS — I509 Heart failure, unspecified: Secondary | ICD-10-CM | POA: Insufficient documentation

## 2015-10-14 MED ORDER — HYDROCODONE-ACETAMINOPHEN 5-325 MG PO TABS: ORAL_TABLET | ORAL | Status: DC

## 2015-10-14 MED ORDER — HYDROCODONE-ACETAMINOPHEN 5-325 MG PO TABS
1.0000 | ORAL_TABLET | Freq: Once | ORAL | Status: AC
Start: 2015-10-14 — End: 2015-10-14
  Administered 2015-10-14: 1 via ORAL
  Filled 2015-10-14: qty 1

## 2015-10-14 NOTE — Progress Notes (Signed)
Orthopedic Tech Progress Note Patient Details:  Martin DallasBillie Lin [birth year] [id number]  Ortho Devices Type of Ortho Device: Crutches Ortho Device/Splint Interventions: Application   [name] 10/14/2015, 2:42 PM

## 2015-10-14 NOTE — Discharge Instructions (Signed)
Rest, Ice intermittently (in the first 24-48 hours), Gentle compression with an Ace wrap, and elevate (Limb above the level of the heart)   Take up to  of ibuprofen (that is usually 4 over the counter pills)  3 times a day for 5 days. Take with food.  Do not hesitate to return to the emergency room for any new, worsening or concerning symptoms.  Please obtain primary care using resource guide below. Let them know that you were seen in the emergency room and that they will need to obtain records for further outpatient management.    Foot Contusion A foot contusion is a deep bruise to the foot. Contusions are the result of an injury that caused bleeding under the skin. The contusion may turn blue, purple, or yellow. Minor injuries will give you a painless contusion, but more severe contusions may stay painful and swollen for a few weeks. CAUSES  A foot contusion comes from a direct blow to that area, such as a heavy object falling on the foot. SYMPTOMS   Swelling of the foot.  Discoloration of the foot.  Tenderness or soreness of the foot. DIAGNOSIS  You will have a physical exam and will be asked about your history. You may need an X-ray of your foot to look for a broken bone (fracture).  TREATMENT  An elastic wrap may be recommended to support your foot. Resting, elevating, and applying cold compresses to your foot are often the best treatments for a foot contusion. Over-the-counter medicines may also be recommended for pain control. HOME CARE INSTRUCTIONS   Put ice on the injured area.  Put ice in a plastic bag.  Place a towel between your skin and the bag.  Leave the ice on for 15-20 minutes, 03-04 times a day.  Only take over-the-counter or prescription medicines for pain, discomfort, or fever as directed by your caregiver.  If told, use an elastic wrap as directed. This can help reduce swelling. You may remove the wrap for sleeping, showering, and bathing. If your toes  become numb, cold, or blue, take the wrap off and reapply it more loosely.  Elevate your foot with pillows to reduce swelling.  Try to avoid standing or walking while the foot is painful. Do not resume use until instructed by your caregiver. Then, begin use gradually. If pain develops, decrease use. Gradually increase activities that do not cause discomfort until you have normal use of your foot.  See your caregiver as directed. It is very important to keep all follow-up appointments in order to avoid any lasting problems with your foot, including long-term (chronic) pain. SEEK IMMEDIATE MEDICAL CARE IF:   You have increased redness, swelling, or pain in your foot.  Your swelling or pain is not relieved with medicines.  You have loss of feeling in your foot or are unable to move your toes.  Your foot turns cold or blue.  You have pain when you move your toes.  Your foot becomes warm to the touch.  Your contusion does not improve in 2 days. MAKE SURE YOU:   Understand these instructions.  Will watch your condition.  Will get help right away if you are not doing well or get worse.   This information is not intended to replace advice given to you by your health care provider. Make sure you discuss any questions you have with your health care provider.   Document Released: 03/26/2006 Document Revised: 12/04/2011 Document Reviewed: 02/08/2015 Elsevier Interactive Patient Education  2016 Elsevier [employer]. [employer] Assistance The [employer] 211 is a great source of information about community services available.  Access by dialing 2-1-1 from anywhere in [place], or by website -  PooledIncome.pl.   Other Local Resources (Updated 06/2015)  Financial Assistance   Services    Phone Number and Address  [facility]  Low-cost medical care - 1st and 3rd Saturday of every month  Must not qualify for public or private insurance and must have  limited income [phone] [address] [place], [place]    [place] [employer] of Social Services  Child care  Emergency assistance for housing and [employer]  Medicaid [phone] [address] [place], [place] [address]   [facility]  Low-cost medical care for children, communicable diseases, sexually-transmitted diseases, immunizations, maternity care, womens health and family planning [phone] [address] [place], [place] [address]  [facility]   Medication assistance for [facility] residents  Must meet income requirements [phone] [address] [place], [place].    [facility] Social Services  Child care  Emergency assistance for housing and [employer]  Medicaid [phone] [address] [place], [place] [address]  [facility]   Low-cost medical care,   Monday through Friday, 9 am to 6 pm.   Accepts Medicare/Medicaid, and self-pay [phone] [address] [place], [place] [address]  [facility]  Low-cost medical care - Monday through Friday, 8:30 am - 5:30 pm  Accepts Medicaid and self-pay [phone] [address] [place], [place] [address]   [facility]  Primary medical care, including for those with sickle cell disease  Accepts Medicare, Medicaid, insurance and self-pay [phone] [address] [place], [place]  [facility]   Primary medical care  Accepts Medicare, [place], insurance and self-pay [phone] [address] [place], [place] [address]   [facility] Department of Social Services  Child care  Emergency assistance for housing and [employer]  Medicaid [phone] [address] [place], [place] [address]  [facility] Department of  Health and [employer]  Child care  Emergency assistance for housing and [employer]  Medicaid [phone] [address] [place], [place] [address]   [facility] Medication Assistance Program  Medication assistance for [facility] residents with no insurance only  Must have a primary care doctor [phone] [address] [place], [place]  [facility]   Primary medical care  [place], [place], insurance  [phone] [address] [place], [place]  [facility]   Medication assistance [phone]  [facility]   Primary medical care  Accepts Medicare, [place], insurance and self-pay [phone] [address] [place], [place] [address]  [facility]   Primary medical care  Accepts Medicare, [place], insurance and self-pay [phone] [address] [place], [place] [address]  [facility]  For [place] residents between the ages of 30 and 3 who do not have any form of health insurance, Medicare, [place], or [place] benefits.  Services are provided free of charge to uninsured patients who fall within federal poverty guidelines.    Hours: Tuesdays and Thursdays, 4:15 - 8 pm [phone] [address] [place], [place] [address]  [facility]     Primary  medical care  Dental care  Nutritional counseling  Pharmacy  Accepts Medicaid, Medicare, most insurance.  Fees are adjusted based on ability to pay.   [phone] [facility] [address] [place], [place]  [phone] [facility] [address] [place], [place]  [phone] [facility] [place], [place]  [phone] [facility], [address] [place], [place]  [phone] [facility] [address] [place], [place]  [facility]   Womens health and family planning [phone] [address] [place], [place]  [facility] of Social Services  Child care  Emergency assistance for housing and [employer]ark[employer]ilities  Food stamps  Medicaid [phone] [address], [place], [place] [address]   [facility]    Ages 7018 and older  Hours: Mondays and Thursdays, 7:00 am - 9:00 am Patients are seen on a first come, first served basis. [phone] ext. [phone] [place], [place]  [facility] Social Services  Child care  Emergency assistance for housing and [employer]ly[employer]arkutilities  Food stamps  Medicaid ([phone]) [phone] [address] [place], [place] [id number]  [facility]  Medication assistance  Rental assistance  Food pantry  Medication assistance  Housing assistance  Emergency food distribution  Utility assistance [phone] [address] [place], [place]  [phone]  [address] [place], [place] [id number] Hours: Tuesdays and Thursdays from 9am - 12 noon by appointment only  [phone] [address] [place], [place] [address]  [facility] Lanae Boast[facility]ara [facility]   Accepts private insurance, [place], and [place].  Payment is based on a sliding scale for those without insurance.  Hours: Mondays, Tuesdays and Thursdays, 8:30 am - 5:30 pm.   [phone] [address] [place], [place]  [facility] - Family Medicine at [facility]    Accepts private insurance, [place], and [place].  Payment is based on a sliding scale for those without insurance. [phone] [address] [place], [place]  [facility] - Pediatrics at [address] Scientist, research (physical sciences)[place]  Accepts private insurance, [employer]icare, and [place]icaid.  Payment is based on a sliding scale for those without insurance [phone] [address], [place], [place]  [facility] - [facility]s at [facility]   Accepts private insurance, [place]are, and [place]icaid.  Payment is based on a sliding scale for those without insurance. [phone] [address] [place], [place]  [facility] - Pediatrics at [place] [facility]  Accepts private insurance, [place], and [place].  Payment is based on a sliding scale for those without insurance. [phone], ext. [phone] [place], [place].    [facility]  Maternity care.  Accepts Medicaid and self-pay. [phone] [address] [place], [place]

## 2015-10-14 NOTE — ED Notes (Signed)
  Pt states last night he fell off a stage and landed on his left foot. Pt states after landing he fell back onto his back. Pt states he has chronic back pain and is hurting no worse today. pt has +2 pedal pulses equal bilaterally. Pt denies hitting head or any LOC.

## 2015-10-14 NOTE — ED Provider Notes (Signed)
CSN: [id number]     Arrival date & time 10/14/15  1221 History  By signing my name below, I, Martin Lin, attest that this documentation has been prepared under the direction and in the presence of [employer], PA-C. Electronically Signed: Ronney Lin, ED Scribe. 10/14/2015. 1:57 PM.    Chief Complaint  Patient presents with  . Foot Pain   The history is provided by the patient. No language interpreter was used.    HPI Comments: Martin Lin is a 40 y.o. male who presents to the Emergency Department complaining of constant, 5/10, aching left foot pain after falling 4-5 feet off a stage last night while setting up for an event, and landing with most of the impact on his left foot. He denies head injury or LOC. No associated symptoms were noted. Patient states he has not tried any treatments or medications for his symptoms. He states walking and bearing weight exacerbate his pain.    Past Medical History  Diagnosis Date  . CHF (congestive heart failure) [facility])    Past Surgical History  Procedure Laterality Date  . Fracture surgery     No family history on file. Social History  Substance Use Topics  . Smoking status: Never Smoker   . Smokeless tobacco: None  . Alcohol Use: No    Review of Systems  A complete 10 system review of systems was obtained and all systems are negative except as noted in the HPI and PMH.    Allergies  Penicillins and Tramadol  Home Medications   Prior to Admission medications   Medication Sig Start Date End Date Taking? Authorizing Provider  ondansetron (ZOFRAN) 4 MG tablet Take 1-2 tablets 3 times a day as needed for nausea, vomiting 09/23/15   [name], MD   BP 126/88 mmHg  Pulse 97  Temp(Src) 97.9 F (36.6 C) (Oral)  Resp 16  Ht  (1.803 m)  Wt 190 lb (86.183 kg)  BMI 26.51 kg/m2  SpO2 99% Physical Exam  Constitutional: He is oriented to person, place, and time. He appears well-developed and well-nourished. No distress.   HENT:  Head: Normocephalic and atraumatic.  Eyes: Conjunctivae and EOM are normal.  Neck: Neck supple. No tracheal deviation present.  Cardiovascular: Normal rate.   Pulmonary/Chest: Effort normal. No respiratory distress.  Musculoskeletal: Normal range of motion.  No deformity. DP and PT pulses are 2+.   Left foot: No TTP of the bilateral malleoli. Diffusely TTP along dorsum of the foot.   Neurological: He is alert and oriented to person, place, and time.  Skin: Skin is warm and dry.  Psychiatric: He has a normal mood and affect. His behavior is normal.  Nursing note and vitals reviewed.   ED Course  Procedures (including critical care time)  DIAGNOSTIC STUDIES: Oxygen Saturation is 99% on RA, normal by my interpretation.    COORDINATION OF CARE: 1:37 PM - Discussed treatment plan with pt at bedside which includes x-ray of the foot and pain medication administered here. Pt verbalized understanding and agreed to plan.   Imaging Review No results found. I have personally reviewed and evaluated these images and lab results as part of my medical decision-making.  MDM   Final diagnoses:  Foot contusion, left, initial encounter    Filed Vitals:   10/14/15 1228  BP: 126/88  Pulse: 97  Temp: 97.9 F (36.6 C)  TempSrc: Oral  Resp: 16  Height:  (1.803 m)  Weight: 86.183 kg  SpO2: 99%  Medications  HYDROcodone-acetaminophen (NORCO/VICODIN) 5-325 MG per tablet 1 tablet (1 tablet Oral Given 10/14/15 1353)    Martin DallasBillie Mulligan is 40 y.o. male presenting with Bilateral foot pain worse on the left than the right after patient fell 4 feet yesterday. Patient is ambulatory but with pain. Physical exam without abnormality. X-ray negative, patient given work note, crutches and recommended rest, ice, compression elevation. Patient specifically requesting pain medication discharge, short course of Vicodin with 7 pills.   Evaluation does not show pathology that would require  ongoing emergent intervention or inpatient treatment. Pt is hemodynamically stable and mentating appropriately. Discussed findings and plan with patient/guardian, who agrees with care plan. All questions answered. Return precautions discussed and outpatient follow up given.   New Prescriptions   HYDROCODONE-ACETAMINOPHEN (NORCO/VICODIN) 5-325 MG TABLET    Take 1-2 tablets by mouth every 6 hours as needed for pain and/or cough.     I personally performed the services described in this documentation, which was scribed in my presence. The recorded information has been reviewed and is accurate.     Martin Emeryicole Kena Limon, PA-C 10/14/15 1454  Martin BaptistEmily Roe Nguyen, MD 10/27/15 [phone]

## 2015-10-14 NOTE — ED Notes (Signed)
Agree with PA assessment 

## 2016-06-04 ENCOUNTER — Emergency Department (HOSPITAL_COMMUNITY)
Admission: EM | Admit: 2016-06-04 | Discharge: 2016-06-04 | Disposition: A | Discharge status: Home / Self Care | Payer: No Typology Code available for payment source | Attending: Emergency Medicine | Admitting: Emergency Medicine

## 2016-06-04 ENCOUNTER — Encounter (HOSPITAL_COMMUNITY): Payer: Self-pay

## 2016-06-04 DIAGNOSIS — R51 Headache: Secondary | ICD-10-CM | POA: Insufficient documentation

## 2016-06-04 DIAGNOSIS — R519 Headache, unspecified: Secondary | ICD-10-CM

## 2016-06-04 DIAGNOSIS — I509 Heart failure, unspecified: Secondary | ICD-10-CM | POA: Insufficient documentation

## 2016-06-04 LAB — CBC
HEMATOCRIT: 44.5 % (ref 39.0–52.0)
HEMOGLOBIN: 15.6 g/dL (ref 13.0–17.0)
MCH: 28.7 pg (ref 26.0–34.0)
MCHC: 35.1 g/dL (ref 30.0–36.0)
MCV: 81.8 fL (ref 78.0–100.0)
Platelets: 256 10*3/uL (ref 150–400)
RBC: 5.44 MIL/uL (ref 4.22–5.81)
RDW: 12.8 % (ref 11.5–15.5)
WBC: 7.9 10*3/uL (ref 4.0–10.5)

## 2016-06-04 LAB — COMPREHENSIVE METABOLIC PANEL
ALK PHOS: 70 U/L (ref 38–126)
ALT: 71 U/L — AB (ref 17–63)
AST: 45 U/L — AB (ref 15–41)
Albumin: 4.5 g/dL (ref 3.5–5.0)
Anion gap: 11 (ref 5–15)
BUN: 18 mg/dL (ref 6–20)
CHLORIDE: 106 mmol/L (ref 101–111)
CO2: 23 mmol/L (ref 22–32)
CREATININE: 1.2 mg/dL (ref 0.61–1.24)
Calcium: 9.8 mg/dL (ref 8.9–10.3)
GFR calc Af Amer: >60 mL/min (ref >60)
Glucose, Bld: 98 mg/dL (ref 65–99)
Potassium: 4.4 mmol/L (ref 3.5–5.1)
Sodium: 140 mmol/L (ref 135–145)
TOTAL PROTEIN: 7.5 g/dL (ref 6.5–8.1)
Total Bilirubin: 0.6 mg/dL (ref 0.3–1.2)

## 2016-06-04 MED ORDER — ONDANSETRON 4 MG PO TBDP
4.0000 mg | ORAL_TABLET | Freq: Once | ORAL | Status: AC
  Administered 2016-06-04: 4 mg via ORAL
  Filled 2016-06-04 (×2): qty 1

## 2016-06-04 MED ORDER — FEXOFENADINE-PSEUDOEPHED ER 60-120 MG PO TB12: 1.0000 | ORAL_TABLET | Freq: Two times a day (BID) | ORAL | 0 refills | Status: AC

## 2016-06-04 NOTE — ED Provider Notes (Signed)
[facility]EM[facility] DEPT Provider Note   CSN: [id number] Arrival date & time: 06/04/16  1346  By signing my name below, I, Arianna Nassar, attest that this documentation has been prepared under the direction and in the presence of Lyndal Pulleyaniel Mariely Mahr, MD.  Electronically Signed: Octavia HeirArianna Nassar, ED Scribe. 06/04/16. 6:05 PM.    History   Chief Complaint Chief Complaint  Patient presents with  . Headache    The history is provided by the patient. No language interpreter was used.  Headache   This is a recurrent problem. The current episode started more than 1 week ago. The problem occurs every few hours. The problem has been gradually worsening. The headache is associated with exposure to cold air and activity. The pain is located in the frontal region. The pain is moderate. The pain does not radiate. Associated symptoms include nausea and vomiting. He has tried acetaminophen and NSAIDs for the symptoms. The treatment provided no relief.   HPI Comments: Martin Lin is a 40 y.o. male who has a PMhx of CHF presents to the Emergency Department complaining of intermittent, gradual worsening, moderate headache x 3 months. Pt says the pain is behind both of his eyes and describes the pain as a sore feeling. Associated nausea, vomiting (x15), and abdominal pain. He also notes urinating clear fluids more frequently for the past few days. He says his headaches have been lasting every 2-3 days. 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 He He has been taking tylenol and ibuprofen to relieve his pain without relief. Pt denies nasal congestion.  Past Medical History:  Diagnosis Date  . CHF (congestive heart failure) (HCC)     There are no active problems to display for this patient.   Past Surgical History:  Procedure Laterality Date  . FRACTURE SURGERY         Home Medications    Prior to Admission medications   Medication Sig  Start Date End Date Taking? Authorizing Provider  HYDROcodone-acetaminophen (NORCO/VICODIN) 5-325 MG tablet Take 1-2 tablets by mouth every 6 hours as needed for pain and/or cough. 10/14/15   Nicole Pisciotta, PA-C  ondansetron (ZOFRAN) 4 MG tablet Take 1-2 tablets 3 times a day as needed for nausea, vomiting 09/23/15   Domenick GongAshley Mortenson, MD    Family History History reviewed. No pertinent family history.  Social History Social History  Substance Use Topics  . Smoking status: Never Smoker  . Smokeless tobacco: Never Used  . Alcohol use No     Allergies   Penicillins and Tramadol   Review of Systems Review of Systems  Gastrointestinal: Positive for nausea and vomiting.  Neurological: Positive for headaches.  All other systems reviewed and are negative.    Physical Exam Updated Vital Signs BP 135/90 (BP Location: Right Arm)   Pulse 72   Temp 99.2 F (37.3 C) (Oral)   Resp 16   SpO2 100%   Physical Exam  Constitutional: He is oriented to person, place, and time. He appears well-developed and well-nourished. No distress.  HENT:  Head: Normocephalic and atraumatic.  Nose: Nose normal.  Eyes: Conjunctivae are normal.  Neck: Neck supple. No tracheal deviation present.  Cardiovascular: Normal rate, regular rhythm and normal heart sounds.   Pulmonary/Chest: Effort normal and breath sounds normal. No respiratory distress.  Abdominal: Soft. He exhibits no distension.  Neurological: He is alert and oriented to person, place, and time. No cranial nerve deficit.  Normal  finger to nose testing and rapid alternating movement   Skin: Skin is warm and dry.  Psychiatric: He has a normal mood and affect.     ED Treatments / Results  DIAGNOSTIC STUDIES: Oxygen Saturation is 100% on RA, normal by my interpretation.  COORDINATION OF CARE:  6:00 PM Discussed treatment plan with pt at bedside and pt agreed to plan.  Labs (all labs ordered are listed, but only abnormal results are  displayed) Labs Reviewed  COMPREHENSIVE METABOLIC PANEL - Abnormal; Notable for the following:       Result Value   AST 45 (*)    ALT 71 (*)    All other components within normal limits  CBC    EKG  EKG Interpretation None       Radiology No results found.  Procedures Procedures (including critical care time)  Medications Ordered in ED Medications - No data to display   Initial Impression / Assessment and Plan / ED Course  I have reviewed the triage vital signs and the nursing notes.  Pertinent labs & imaging results that were available during my care of the patient were reviewed by me and considered in my medical decision making (see chart for details).  Clinical Course     40 y.o. 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 No deficits or signs of ICH. Appears to be intermittent sinus headache. Will start on oral decongestant and antihistamine. Plan to follow up with PCP as needed and return precautions discussed for worsening or new concerning symptoms.   Final Clinical Impressions(s) / ED Diagnoses   Final diagnoses:  Nonintractable episodic headache, unspecified headache type   I personally performed the services described in this documentation, which was scribed in my presence. The recorded information has been reviewed and is accurate.   New Prescriptions Discharge Medication List as of 06/04/2016  6:35 PM    START taking these medications   Details  fexofenadine-pseudoephedrine (ALLEGRA-D) 60-120 MG 12 hr tablet Take 1 tablet by mouth every 12 (twelve) hours., Starting Mon 06/04/2016, Until Mon 06/18/2016, Print         Lyndal Pulleyaniel Arienne Gartin, MD 06/05/16 1344

## 2016-06-04 NOTE — ED Triage Notes (Signed)
Pt reports recurrent headaches X2-3 months. 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 He reports nausea as well as emesis that started last night. Pt alert and oriented X4.

## 2017-01-21 ENCOUNTER — Encounter (HOSPITAL_COMMUNITY): Payer: Self-pay

## 2017-01-21 ENCOUNTER — Emergency Department (HOSPITAL_COMMUNITY)
Admission: EM | Admit: 2017-01-21 | Discharge: 2017-01-21 | Disposition: A | Discharge status: Home / Self Care | Payer: No Typology Code available for payment source | Attending: Emergency Medicine | Admitting: Emergency Medicine

## 2017-01-21 DIAGNOSIS — Z880 Allergy status to penicillin: Secondary | ICD-10-CM | POA: Insufficient documentation

## 2017-01-21 DIAGNOSIS — I509 Heart failure, unspecified: Secondary | ICD-10-CM | POA: Insufficient documentation

## 2017-01-21 DIAGNOSIS — J0100 Acute maxillary sinusitis, unspecified: Secondary | ICD-10-CM | POA: Insufficient documentation

## 2017-01-21 MED ORDER — FLUTICASONE PROPIONATE 50 MCG/ACT NA SUSP: 1.0000 | Freq: Every day | NASAL | 0 refills | Status: DC

## 2017-01-21 MED ORDER — DOXYCYCLINE HYCLATE 100 MG PO CAPS: 200.0000 mg | ORAL_CAPSULE | Freq: Every day | ORAL | 0 refills | Status: AC

## 2017-01-21 MED ORDER — DOXYCYCLINE HYCLATE 100 MG PO TABS
200.0000 mg | ORAL_TABLET | Freq: Once | ORAL | Status: AC
  Administered 2017-01-21: 200 mg via ORAL
  Filled 2017-01-21: qty 2

## 2017-01-21 MED ORDER — PSEUDOEPHEDRINE HCL ER 120 MG PO TB12
120.0000 mg | ORAL_TABLET | Freq: Once | ORAL | Status: AC
  Administered 2017-01-21: 120 mg via ORAL
  Filled 2017-01-21: qty 1

## 2017-01-21 MED ORDER — DIPHENHYDRAMINE HCL 50 MG/ML IJ SOLN
50.0000 mg | Freq: Once | INTRAMUSCULAR | Status: AC
  Administered 2017-01-21: 50 mg via INTRAVENOUS
  Filled 2017-01-21: qty 1

## 2017-01-21 MED ORDER — SODIUM CHLORIDE 0.9 % IV BOLUS (SEPSIS)
1000.0000 mL | Freq: Once | INTRAVENOUS | Status: AC
  Administered 2017-01-21: 1000 mL via INTRAVENOUS

## 2017-01-21 MED ORDER — KETOROLAC TROMETHAMINE 30 MG/ML IJ SOLN
30.0000 mg | Freq: Once | INTRAMUSCULAR | Status: AC
  Administered 2017-01-21: 30 mg via INTRAVENOUS
  Filled 2017-01-21: qty 1

## 2017-01-21 MED ORDER — PROCHLORPERAZINE EDISYLATE 5 MG/ML IJ SOLN
10.0000 mg | Freq: Once | INTRAMUSCULAR | Status: AC
  Administered 2017-01-21: 10 mg via INTRAVENOUS
  Filled 2017-01-21: qty 2

## 2017-01-21 NOTE — Discharge Instructions (Signed)
Please take all of your antibiotics until finished!   You may develop abdominal discomfort or diarrhea from the antibiotic.  You may help offset this with probiotics which you can buy or get in yogurt. Do not eat  or take the probiotics until 2 hours after your antibiotic. You may use Flonase for nasal congestion. Sudafed over-the-counter will also be helpful. Follow-up with primary care physician for reevaluation if symptoms persist. Return to the ED immediately if any concerning signs or symptoms develop.

## 2017-01-21 NOTE — ED Triage Notes (Signed)
Pt presents for evaluation of URI symptoms x 2 weeks. Pt reports nasal congestion/drainage with facial/sinus tenderness and headache. Pt reports taking multiple over the counter meds with no improvement.

## 2017-01-21 NOTE — ED Provider Notes (Signed)
[facility]-EMERGENCY DEPT Provider Note   CSN: [id number] Arrival date & time: 01/21/17  1311  By signing my name below, I, Vista Minkobert Ross, attest that this documentation has been prepared under the direction and in the presence of [facility]ina Derris Millan PA-C.  Electronically Signed: Vista Minkobert Ross, ED Scribe. 01/21/17. 3:15 PM.   History   Chief Complaint Chief Complaint  Patient presents with  . URI    HPI HPI Comments: Martin Lin is a 41 y.o. male who presents to the Emergency Department complaining of Acute onset, gradually worsening bilateral sinus pain and pressure with associated headache for 2 weeks. Sinus pain is constant as is headache which is described as throbbing and aching frontal headache which radiates to the back. He notes associated phonophobia and photophobia. No vision changes. No aggravating or alleviating factors noted. He also notes associated sneezing, coughing, nasal congestion with yellow mucus production and sore throat. He states he has tried "every over-the-counter medication you can think of "without any relief of symptoms. Notes he had a history of sinus infections when he was younger. He works outside in [employer] and his symptoms worsen outside which he things is due to high pollen count. Denies nausea, vomiting, diarrhea, abdominal pain, chest pain. Allergic to Penicillin.  The history is provided by the patient. No language interpreter was used.    Past Medical History:  Diagnosis Date  . CHF (congestive heart failure) (HCC)     There are no active problems to display for this patient.   Past Surgical History:  Procedure Laterality Date  . FRACTURE SURGERY       Home Medications    Prior to Admission medications   Medication Sig Start Date End Date Taking? Authorizing Provider  doxycycline (VIBRAMYCIN) 100 MG capsule Take 2 capsules (200 mg total) by mouth daily. 01/21/17 01/31/17  Michela PitcherFawze, Javarion Douty A, PA-C  fluticasone (FLONASE) 50 MCG/ACT nasal spray Place 1 spray  into both nostrils daily. 01/21/17   Michela PitcherFawze, Beckham Capistran A, PA-C  ondansetron (ZOFRAN) 4 MG tablet Take 1-2 tablets 3 times a day as needed for nausea, vomiting 09/23/15   Domenick GongMortenson, Ashley, MD   Family History No family history on file.  Social History Social History  Substance Use Topics  . Smoking status: Never Smoker  . Smokeless tobacco: Never Used  . Alcohol use No    Allergies   Penicillins and Tramadol   Review of Systems Review of Systems  Constitutional: Negative for chills and fever.  HENT: Positive for congestion, rhinorrhea, sinus pressure and sore throat. Negative for ear pain.   Respiratory: Positive for cough. Negative for shortness of breath.   Cardiovascular: Negative for chest pain.  Gastrointestinal: Negative for abdominal pain, diarrhea, nausea and vomiting.     Physical Exam Updated Vital Signs BP 135/88 (BP Location: Right Arm)   Pulse 70   Temp 98.1 F (36.7 C) (Oral)   Resp 17   SpO2 98%   Physical Exam  Constitutional: He appears well-developed and well-nourished. No distress.  HENT:  Head: Normocephalic and atraumatic.  Mouth/Throat: Oropharynx is clear and moist. No oropharyngeal exudate.  TM's normal bilaterally. Frontal and maxillary sinus tenderness upon palpation. Nasal septum is midline with pink mucosa and nasal congestion bilaterally. No septal hematoma. Posterior oropharynx is normal. No uvular deviation. No tonsillar hypertrophy or edema.  Eyes: Pupils are equal, round, and reactive to light. Conjunctivae are normal. Right eye exhibits no discharge. Left eye exhibits no discharge.  Neck: Normal range of motion. Neck supple. No  JVD present. No tracheal deviation present.  Cardiovascular: Normal rate, regular rhythm and normal heart sounds.  Exam reveals no gallop and no friction rub.   No murmur heard. Pulmonary/Chest: Effort normal and breath sounds normal. No respiratory distress. He has no wheezes. He has no rales.  Abdominal: He exhibits no  distension.  Musculoskeletal: He exhibits no edema.  Lymphadenopathy:    He has no cervical adenopathy.  Neurological: He is alert.  Skin: No erythema.  Psychiatric: He has a normal mood and affect. His behavior is normal.  Nursing note and vitals reviewed.   ED Treatments / Results  DIAGNOSTIC STUDIES: Oxygen Saturation is 100% on RA, normal by my interpretation.  COORDINATION OF CARE: 3:10 PM-Discussed treatment plan with pt at bedside and pt agreed to plan.   Labs (all labs ordered are listed, but only abnormal results are displayed) Labs Reviewed - No data to display  EKG  EKG Interpretation None       Radiology No results found.  Procedures Procedures (including critical care time)  Medications Ordered in ED Medications  sodium chloride 0.9 % bolus 1,000 mL (0 mLs Intravenous Stopped 01/21/17 1647)  ketorolac (TORADOL) 30 MG/ML injection 30 mg (30 mg Intravenous Given 01/21/17 1601)  diphenhydrAMINE (BENADRYL) injection 50 mg (50 mg Intravenous Given 01/21/17 1558)  prochlorperazine (COMPAZINE) injection 10 mg (10 mg Intravenous Given 01/21/17 1553)  doxycycline (VIBRA-TABS) tablet 200 mg (200 mg Oral Given 01/21/17 1551)  pseudoephedrine (SUDAFED) 12 hr tablet 120 mg (120 mg Oral Given 01/21/17 1615)     Initial Impression / Assessment and Plan / ED Course  I have reviewed the triage vital signs and the nursing notes.  Pertinent labs & imaging results that were available during my care of the patient were reviewed by me and considered in my medical decision making (see chart for details).     Patient with complaint of sinus pain and headache for 2 weeks. Afebrile, vital signs are stable. No focal neurological deficits. He is requesting treatment for his headache and refuses Tylenol and Motrin because it has not been helpful to him in the past. Significant improvement of headache with fluids, Toradol, Benadryl, and Compazine. Given symptom duration I suspect bacterial  sinusitis. Will treat with doxycycline due to allergy to penicillin. First dose given in the ED. Recommend follow-up with primary care physician for reevaluation. Discussed indications for return to the ED. Pt verbalized understanding of and agreement with plan and is safe for discharge home at this time.   Final Clinical Impressions(s) / ED Diagnoses   Final diagnoses:  Acute non-recurrent maxillary sinusitis    New Prescriptions Discharge Medication List as of 01/21/2017  4:25 PM    START taking these medications   Details  doxycycline (VIBRAMYCIN) 100 MG capsule Take 2 capsules (200 mg total) by mouth daily., Starting Mon 01/21/2017, Until Thu 01/31/2017, Print    fluticasone (FLONASE) 50 MCG/ACT nasal spray Place 1 spray into both nostrils daily., Starting Mon 01/21/2017, Print      I personally performed the services described in this documentation, which was scribed in my presence. The recorded information has been reviewed and is accurate.     Jeanie Sewer, PA-C 01/21/17 1803    Tilden Fossa, MD 01/21/17 2113

## 2017-02-13 ENCOUNTER — Encounter (HOSPITAL_COMMUNITY): Payer: Self-pay | Admitting: *Deleted

## 2017-02-13 ENCOUNTER — Emergency Department (HOSPITAL_COMMUNITY)
Admission: EM | Admit: 2017-02-13 | Discharge: 2017-02-13 | Disposition: A | Discharge status: Home / Self Care | Payer: No Typology Code available for payment source | Attending: Emergency Medicine | Admitting: Emergency Medicine

## 2017-02-13 DIAGNOSIS — T7840XA Allergy, unspecified, initial encounter: Secondary | ICD-10-CM

## 2017-02-13 DIAGNOSIS — I509 Heart failure, unspecified: Secondary | ICD-10-CM | POA: Insufficient documentation

## 2017-02-13 DIAGNOSIS — T63441A Toxic effect of venom of bees, accidental (unintentional), initial encounter: Secondary | ICD-10-CM | POA: Insufficient documentation

## 2017-02-13 MED ORDER — EPINEPHRINE 0.3 MG/0.3ML IJ SOAJ: 0.3000 mg | Freq: Once | INTRAMUSCULAR | 1 refills | Status: AC

## 2017-02-13 MED ORDER — PREDNISONE 20 MG PO TABS: 40.0000 mg | ORAL_TABLET | Freq: Every day | ORAL | 0 refills | Status: DC

## 2017-02-13 MED ORDER — SODIUM CHLORIDE 0.9 % IV BOLUS (SEPSIS)
1000.0000 mL | Freq: Once | INTRAVENOUS | Status: AC
  Administered 2017-02-13: 1000 mL via INTRAVENOUS

## 2017-02-13 MED ORDER — METHYLPREDNISOLONE SODIUM SUCC 125 MG IJ SOLR
125.0000 mg | Freq: Once | INTRAMUSCULAR | Status: AC
  Administered 2017-02-13: 125 mg via INTRAVENOUS
  Filled 2017-02-13: qty 2

## 2017-02-13 MED ORDER — FAMOTIDINE IN NACL 20-0.9 MG/50ML-% IV SOLN
20.0000 mg | Freq: Once | INTRAVENOUS | Status: AC
  Administered 2017-02-13: 20 mg via INTRAVENOUS
  Filled 2017-02-13: qty 50

## 2017-02-13 NOTE — Discharge Instructions (Signed)
I have given you an EpiPen. Please use if you develop any difficulties breathing or swallowing following a bee sting. Take the prednisone starting tomorrow for the next 4 days. Continue taking over-the-counter Zantac or Pepcid daily. Continue taking her Benadryl.  Return to the ED if he develops any worsening symptoms.

## 2017-02-13 NOTE — ED Notes (Signed)
ED Provider at bedside. 

## 2017-02-13 NOTE — ED Triage Notes (Signed)
Pt reports multiple bee stings today to his nose. Has hx of allergic reaction to bee sting but was out of his epi pen. Pt felt like throat was swelling so pt took benadryl 50mg  pta. Reports being drowsy at triage. Airway intact.

## 2017-02-13 NOTE — ED Notes (Signed)
Pt ambulatory to restroom with steady gait.

## 2017-02-14 NOTE — ED Provider Notes (Signed)
[facility] Provider Note   CSN: [id number] Arrival date & time: 02/13/17  1252     History   Chief Complaint Chief Complaint  Patient presents with  . Allergic Reaction    HPI Martin Lin is a 41 y.o. male.  HPI 41 year old Caucasian male presents to the emergency department today with complaints of allergic reaction. Patient states that approximately 1 hour prior to arrival he was working in the yard doing landscaping when 2 bees stung his face. 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 Patient states that initially like his throat was swelling so he took Benadryl 50 mg prior to arrival. The patient states that his breathing has since improved and does not feel like his throat was swelling. Patient denies any difficulty swallowing or breathing at this time. Patient denies any rashes. Denies any lightheadedness or dizziness, nausea, emesis. The patient states that his EpiPen is out of date so he did not use it. Denies any wheezing. Past Medical History:  Diagnosis Date  . CHF (congestive heart failure) (HCC)     There are no active problems to display for this patient.   Past Surgical History:  Procedure Laterality Date  . FRACTURE SURGERY         Home Medications    Prior to Admission medications   Medication Sig Start Date End Date Taking? Authorizing Provider  diphenhydrAMINE (BENADRYL) 25 mg capsule Take 50 mg by mouth every 6 (six) hours as needed.   Yes [provider]  ibuprofen (ADVIL,MOTRIN) 200 MG tablet Take 200 mg by mouth every 6 (six) hours as needed for headache.   Yes [provider]  fluticasone (FLONASE) 50 MCG/ACT nasal spray Place 1 spray into both nostrils daily. Patient not taking: Reported on 02/13/2017 01/21/17   [name], PA-C  ondansetron (ZOFRAN) 4 MG tablet Take 1-2 tablets 3 times a day as needed for nausea, vomiting Patient not taking: Reported  on 02/13/2017 09/23/15   [name], MD  predniSONE (DELTASONE) 20 MG tablet Take 2 tablets (40 mg total) by mouth daily with breakfast. 02/13/17   [name], PA-C    Family History History reviewed. No pertinent family history.  Social History Social History  Substance Use Topics  . Smoking status: Never Smoker  . Smokeless tobacco: Never Used  . Alcohol use No     Allergies   Bee pollen-1000-royal jelly [nutritional supplements]; Penicillins; and Tramadol   Review of Systems Review of Systems  Constitutional: Negative for chills and fever.  HENT: Negative for congestion, trouble swallowing and voice change.   Respiratory: Negative for cough and shortness of breath.   Cardiovascular: Negative for chest pain.  Gastrointestinal: Negative for abdominal pain, diarrhea, nausea and vomiting.  Musculoskeletal: Negative for joint swelling.  Skin: Positive for wound. Negative for rash.  Neurological: Negative for dizziness and light-headedness.  Psychiatric/Behavioral: Negative for sleep disturbance.     Physical Exam Updated Vital Signs BP 138/77 (BP Location: Right Arm)   Pulse 85   Temp 99.2 F (37.3 C) (Oral)   Resp 14   SpO2 98%   Physical Exam  Constitutional: He is oriented to person, place, and time. He appears well-developed and well-nourished.  Non-toxic appearance. No distress.  HENT:  Head: Normocephalic and atraumatic.  Mouth/Throat: Oropharynx is clear and moist.  Patient managing secretions and maintaining airway. Oropharynx is clear without any edema. No angioedema noted. Voice  is normal. Speaking complete sentences. No stridor.  Nose with erythema but no signs of infection. No hives noted.  Eyes: Pupils are equal, round, and reactive to light. Conjunctivae are normal. Right eye exhibits no discharge. Left eye exhibits no discharge.  Neck: Normal range of motion. Neck supple.  Cardiovascular: Normal rate, regular rhythm, normal heart sounds  and intact distal pulses.  Exam reveals no gallop and no friction rub.   No murmur heard. Pulmonary/Chest: Effort normal and breath sounds normal. No respiratory distress. He has no wheezes. He has no rales. He exhibits no tenderness.  Abdominal: Soft. Bowel sounds are normal. He exhibits no distension. There is no tenderness. There is no rebound and no guarding.  Musculoskeletal: Normal range of motion. He exhibits no tenderness.  Lymphadenopathy:    He has no cervical adenopathy.  Neurological: He is alert and oriented to person, place, and time.  Skin: Skin is warm and dry. Capillary refill takes less than 2 seconds. No rash noted.  Psychiatric: His behavior is normal. Judgment and thought content normal.  Nursing note and vitals reviewed.    ED Treatments / Results  Labs (all labs ordered are listed, but only abnormal results are displayed) Labs Reviewed - No data to display  EKG  EKG Interpretation None       Radiology No results found.  Procedures Procedures (including critical care time)  Medications Ordered in ED Medications  famotidine (PEPCID) IVPB 20 mg premix (0 mg Intravenous Stopped 02/13/17 1815)  methylPREDNISolone sodium succinate (SOLU-MEDROL) 125 mg/2 mL injection 125 mg (125 mg Intravenous Given 02/13/17 1710)  sodium chloride 0.9 % bolus 1,000 mL (0 mLs Intravenous Stopped 02/13/17 1844)     Initial Impression / Assessment and Plan / ED Course  I have reviewed the triage vital signs and the nursing notes.  Pertinent labs & imaging results that were available during my care of the patient were reviewed by me and considered in my medical decision making (see chart for details).     Patient re-evaluated prior to dc, is hemodynamically stable, in no respiratory distress, and denies the feeling of throat closing. Pt has been advised to take OTC benadryl & return to the ED if they have a mod-severe allergic rxn (s/s including throat closing, difficulty  breathing, swelling of lips face or tongue). Pt is to follow up with their PCP. Pt is agreeable with plan & verbalizes understanding.   Final Clinical Impressions(s) / ED Diagnoses   Final diagnoses:  Allergic reaction, initial encounter    New Prescriptions Discharge Medication List as of 02/13/2017  7:06 PM    START taking these medications   Details  EPINEPHrine 0.3 mg/0.3 mL IJ SOAJ injection Inject 0.3 mLs (0.3 mg total) into the muscle once., Starting Wed 02/13/2017, Print    predniSONE (DELTASONE) 20 MG tablet Take 2 tablets (40 mg total) by mouth daily with breakfast., Starting Wed 02/13/2017, Print         Rise MuLeaphart, Kenneth T, PA-C 02/14/17 [id number]    Bethann BerkshireZammit, Joseph, MD 02/14/17 1257

## 2017-04-08 ENCOUNTER — Encounter (HOSPITAL_COMMUNITY): Payer: Self-pay | Admitting: Emergency Medicine

## 2017-04-08 ENCOUNTER — Emergency Department (HOSPITAL_COMMUNITY)
Admission: EM | Admit: 2017-04-08 | Discharge: 2017-04-08 | Disposition: A | Discharge status: Home / Self Care | Payer: No Typology Code available for payment source | Attending: Emergency Medicine | Admitting: Emergency Medicine

## 2017-04-08 DIAGNOSIS — I509 Heart failure, unspecified: Secondary | ICD-10-CM | POA: Insufficient documentation

## 2017-04-08 DIAGNOSIS — R112 Nausea with vomiting, unspecified: Secondary | ICD-10-CM | POA: Insufficient documentation

## 2017-04-08 DIAGNOSIS — Z79899 Other long term (current) drug therapy: Secondary | ICD-10-CM | POA: Insufficient documentation

## 2017-04-08 LAB — CBC
HCT: 44.8 % (ref 39.0–52.0)
Hemoglobin: 14.5 g/dL (ref 13.0–17.0)
MCH: 26.5 pg (ref 26.0–34.0)
MCHC: 32.4 g/dL (ref 30.0–36.0)
MCV: 81.8 fL (ref 78.0–100.0)
Platelets: 243 10*3/uL (ref 150–400)
RBC: 5.48 MIL/uL (ref 4.22–5.81)
RDW: 14.2 % (ref 11.5–15.5)
WBC: 9.3 10*3/uL (ref 4.0–10.5)

## 2017-04-08 LAB — URINALYSIS, ROUTINE W REFLEX MICROSCOPIC
Bilirubin Urine: NEGATIVE
GLUCOSE, UA: NEGATIVE mg/dL
Hgb urine dipstick: NEGATIVE
Ketones, ur: NEGATIVE mg/dL
LEUKOCYTES UA: NEGATIVE
Nitrite: NEGATIVE
PH: 7 (ref 5.0–8.0)
Protein, ur: NEGATIVE mg/dL
Specific Gravity, Urine: 1.006 (ref 1.005–1.030)

## 2017-04-08 LAB — COMPREHENSIVE METABOLIC PANEL
ALK PHOS: 83 U/L (ref 38–126)
ALT: 36 U/L (ref 17–63)
AST: 28 U/L (ref 15–41)
Albumin: 4.1 g/dL (ref 3.5–5.0)
Anion gap: 8 (ref 5–15)
BILIRUBIN TOTAL: 0.4 mg/dL (ref 0.3–1.2)
BUN: 13 mg/dL (ref 6–20)
CALCIUM: 9.6 mg/dL (ref 8.9–10.3)
CHLORIDE: 102 mmol/L (ref 101–111)
CO2: 27 mmol/L (ref 22–32)
CREATININE: 1.02 mg/dL (ref 0.61–1.24)
Glucose, Bld: 92 mg/dL (ref 65–99)
Potassium: 4.3 mmol/L (ref 3.5–5.1)
Sodium: 137 mmol/L (ref 135–145)
TOTAL PROTEIN: 7.4 g/dL (ref 6.5–8.1)

## 2017-04-08 LAB — LIPASE, BLOOD: LIPASE: 36 U/L (ref 11–51)

## 2017-04-08 MED ORDER — ONDANSETRON HCL 8 MG PO TABS: 8.0000 mg | ORAL_TABLET | Freq: Three times a day (TID) | ORAL | 0 refills | Status: DC | PRN

## 2017-04-08 MED ORDER — ONDANSETRON 4 MG PO TBDP
8.0000 mg | ORAL_TABLET | Freq: Once | ORAL | Status: AC
  Administered 2017-04-08: 8 mg via ORAL
  Filled 2017-04-08: qty 2

## 2017-04-08 NOTE — ED Triage Notes (Signed)
Has been vomiting since 11 am yesterday having some cramping

## 2017-04-08 NOTE — Discharge Instructions (Signed)
Start with a clear liquid diet, then gradually advance to regular foods after a day or 2.

## 2017-04-08 NOTE — ED Provider Notes (Signed)
[facility] EMERGENCY DEPARTMENT Provider Note   CSN: [id number] Arrival date & time: 04/08/17  1350     History   Chief Complaint Chief Complaint  Patient presents with  . Emesis    HPI Martin Lin is a 41 y.o. male.  He presents for evaluation of nausea and vomiting leading to dry heaves, which started about 10 PM last night.  No known abnormal food ingestions, or known sick contacts.  No documented fever.  No weakness, chills, abdominal pain, back pain, diarrhea, paresthesia, or rash.  There are no other known modifying factors.  HPI  Past Medical History:  Diagnosis Date  . CHF (congestive heart failure) (HCC)     There are no active problems to display for this patient.   Past Surgical History:  Procedure Laterality Date  . FRACTURE SURGERY         Home Medications    Prior to Admission medications   Medication Sig Start Date End Date Taking? Authorizing Provider  diphenhydrAMINE (BENADRYL) 25 mg capsule Take 50 mg by mouth every 6 (six) hours as needed.    [provider]  fluticasone (FLONASE) 50 MCG/ACT nasal spray Place 1 spray into both nostrils daily. Patient not taking: Reported on 02/13/2017 01/21/17   [name], PA-C  ibuprofen (ADVIL,MOTRIN) 200 MG tablet Take 200 mg by mouth every 6 (six) hours as needed for headache.    [provider]  ondansetron (ZOFRAN) 8 MG tablet Take 1 tablet (8 mg total) by mouth every 8 (eight) hours as needed for nausea or vomiting. 04/08/17   Mancel Bale, MD  predniSONE (DELTASONE) 20 MG tablet Take 2 tablets (40 mg total) by mouth daily with breakfast. 02/13/17   [name], PA-C    Family History No family history on file.  Social History Social History  Substance Use Topics  . Smoking status: Never Smoker  . Smokeless tobacco: Never Used  . Alcohol use No     Allergies   Bee pollen-1000-royal jelly [nutritional supplements]; Penicillins; and  Tramadol   Review of Systems Review of Systems  All other systems reviewed and are negative.    Physical Exam Updated Vital Signs BP (!) 118/53 (BP Location: Left Arm)   Pulse 67   Temp 97.9 F (36.6 C) (Oral)   Resp 16   SpO2 100%   Physical Exam  Constitutional: He is oriented to person, place, and time. He appears well-developed and well-nourished.  HENT:  Head: Normocephalic and atraumatic.  Right Ear: External ear normal.  Left Ear: External ear normal.  Eyes: Pupils are equal, round, and reactive to light. Conjunctivae and EOM are normal.  Neck: Normal range of motion and phonation normal. Neck supple.  Cardiovascular: Normal rate, regular rhythm and normal heart sounds.   Pulmonary/Chest: Effort normal and breath sounds normal. No respiratory distress. He exhibits no bony tenderness.  Abdominal: Soft. There is no tenderness.  Musculoskeletal: Normal range of motion.  Neurological: He is alert and oriented to person, place, and time. No cranial nerve deficit or sensory deficit. He exhibits normal muscle tone. Coordination normal.  Skin: Skin is warm, dry and intact.  Psychiatric: He has a normal mood and affect. His behavior is normal. Judgment and thought content normal.  Nursing note and vitals reviewed.    ED Treatments / Results  Labs (all labs ordered are listed, but only abnormal results are displayed) Labs Reviewed  URINALYSIS, ROUTINE W REFLEX MICROSCOPIC - Abnormal; Notable for the  following:       Result Value   Color, Urine STRAW (*)    All other components within normal limits  LIPASE, BLOOD  COMPREHENSIVE METABOLIC PANEL  CBC    EKG  EKG Interpretation None       Radiology No results found.  Procedures Procedures (including critical care time)  Medications Ordered in ED Medications  ondansetron (ZOFRAN-ODT) disintegrating tablet 8 mg (8 mg Oral Given 04/08/17 1632)     Initial Impression / Assessment and Plan / ED Course  I have  reviewed the triage vital signs and the nursing notes.  Pertinent labs & imaging results that were available during my care of the patient were reviewed by me and considered in my medical decision making (see chart for details).      Patient Vitals for the past 24 hrs:  BP Temp Temp src Pulse Resp SpO2  04/08/17 1432 (!) 118/53 97.9 F (36.6 C) Oral 67 16 100 %    4:53 PM Reevaluation with update and discussion. After initial assessment and treatment, an updated evaluation reveals no change in clinical status.  Findings discussed with the patient and all questions answered. Liridona Mashaw L     Final Clinical Impressions(s) / ED Diagnoses   Final diagnoses:  Non-intractable vomiting with nausea, unspecified vomiting type   Nonspecific vomiting, differential includes food poisoning, and viral illness.  Doubt serious bacterial infection, metabolic instability or impending vascular collapse.  Nursing Notes Reviewed/ Care Coordinated Applicable Imaging Reviewed Interpretation of Laboratory Data incorporated into ED treatment  The patient appears reasonably screened and/or stabilized for discharge and I doubt any other medical condition or other [facility] requiring further screening, evaluation, or treatment in the ED at this time prior to discharge.  Plan: Home Medications-OTC analgesia, as needed; Home Treatments-gradually advance diet; return here if the recommended treatment, does not improve the symptoms; Recommended follow up-PCP as needed  New Prescriptions New Prescriptions   ONDANSETRON (ZOFRAN) 8 MG TABLET    Take 1 tablet (8 mg total) by mouth every 8 (eight) hours as needed for nausea or vomiting.     Mancel BaleWentz, Tarek Cravens, MD 04/08/17 [phone]

## 2017-07-24 ENCOUNTER — Encounter (HOSPITAL_COMMUNITY): Payer: Self-pay | Admitting: Emergency Medicine

## 2017-07-24 ENCOUNTER — Emergency Department (HOSPITAL_COMMUNITY)
Admission: EM | Admit: 2017-07-24 | Discharge: 2017-07-24 | Disposition: A | Discharge status: Home / Self Care | Payer: No Typology Code available for payment source | Attending: Emergency Medicine | Admitting: Emergency Medicine

## 2017-07-24 ENCOUNTER — Other Ambulatory Visit: Payer: Self-pay

## 2017-07-24 DIAGNOSIS — I509 Heart failure, unspecified: Secondary | ICD-10-CM | POA: Insufficient documentation

## 2017-07-24 DIAGNOSIS — K089 Disorder of teeth and supporting structures, unspecified: Secondary | ICD-10-CM | POA: Insufficient documentation

## 2017-07-24 DIAGNOSIS — K0889 Other specified disorders of teeth and supporting structures: Secondary | ICD-10-CM

## 2017-07-24 DIAGNOSIS — Z79899 Other long term (current) drug therapy: Secondary | ICD-10-CM | POA: Insufficient documentation

## 2017-07-24 MED ORDER — NAPROXEN 500 MG PO TABS: 500.0000 mg | ORAL_TABLET | Freq: Two times a day (BID) | ORAL | 0 refills | Status: DC

## 2017-07-24 MED ORDER — LIDOCAINE VISCOUS 2 % MT SOLN: 15.0000 mL | OROMUCOSAL | 0 refills | Status: DC | PRN

## 2017-07-24 MED ORDER — CLINDAMYCIN HCL 150 MG PO CAPS: 450.0000 mg | ORAL_CAPSULE | Freq: Three times a day (TID) | ORAL | 0 refills | Status: AC

## 2017-07-24 NOTE — ED Triage Notes (Signed)
C/o dental pain to his left side x 3 weeks.

## 2017-07-24 NOTE — ED Provider Notes (Signed)
[facility] EMERGENCY DEPARTMENT Provider Note   CSN: [id number] Arrival date & time: 07/24/17  1419     History   Chief Complaint Chief Complaint  Patient presents with  . Dental Pain    HPI Martin Lin is a 42 y.o. male presents emerged department today for left-sided dental pain times 3 weeks.  Patient notes that he has very poor dentition.  He has not followed up with a dentist in approximately 1 year.  He has scheduled appointment with Dr. Francee Piccolo for 07/31/2017.  He is presenting today because he feels he may require antibiotics.  He notes that he has been using Tylenol, ibuprofen, Orajel, vanilla extract and hot sauce for pain without relief.  Nothing makes his symptoms better or worse.  Denies fever, chills, inability to control secretions, N/V, voice change, facial or neck swelling or trauma.   HPI  Past Medical History:  Diagnosis Date  . CHF (congestive heart failure) (HCC)     There are no active problems to display for this patient.   Past Surgical History:  Procedure Laterality Date  . FRACTURE SURGERY         Home Medications    Prior to Admission medications   Medication Sig Start Date End Date Taking? Authorizing Provider  diphenhydrAMINE (BENADRYL) 25 mg capsule Take 50 mg by mouth every 6 (six) hours as needed.    [provider]  fluticasone (FLONASE) 50 MCG/ACT nasal spray Place 1 spray into both nostrils daily. Patient not taking: Reported on 02/13/2017 01/21/17   [name], PA-C  ibuprofen (ADVIL,MOTRIN) 200 MG tablet Take 200 mg by mouth every 6 (six) hours as needed for headache.    [provider]  ondansetron (ZOFRAN) 8 MG tablet Take 1 tablet (8 mg total) by mouth every 8 (eight) hours as needed for nausea or vomiting. 04/08/17   Mancel Bale, MD  predniSONE (DELTASONE) 20 MG tablet Take 2 tablets (40 mg total) by mouth daily with breakfast. 02/13/17   Leaphart, Lynann Beaver, PA-C  promethazine (PHENERGAN)  25 MG tablet Take 1 tablet (25 mg total) by mouth every 8 (eight) hours as needed for nausea or vomiting. Patient not taking: Reported on 09/01/2015 06/20/15 09/23/15  Charlestine Night, PA-C    Family History No family history on file.  Social History Social History   Tobacco Use  . Smoking status: Never Smoker  . Smokeless tobacco: Never Used  Substance Use Topics  . Alcohol use: No  . Drug use: No     Allergies   Bee pollen-1000-royal jelly [nutritional supplements]; Penicillins; and Tramadol   Review of Systems Review of Systems  All other systems reviewed and are negative.    Physical Exam Updated Vital Signs BP 137/88 (BP Location: Right Arm)   Pulse 63   Temp 98.6 F (37 C) (Oral)   Resp 14   Ht 5\' 11"  (1.803 m)   Wt 99.8 kg (220 lb)   SpO2 99%   BMI 30.68 kg/m   Physical Exam  Constitutional: He appears well-developed and well-nourished. No distress.  HENT:  Head: Normocephalic and atraumatic.  Right Ear: Hearing, tympanic membrane, external ear and ear canal normal. No foreign bodies. Tympanic membrane is not injected, not perforated, not erythematous, not retracted and not bulging.  Left Ear: Hearing, tympanic membrane, external ear and ear canal normal. No foreign bodies. Tympanic membrane is not injected, not perforated, not erythematous, not retracted and not bulging.  Nose: Nose normal. No mucosal  edema, rhinorrhea, sinus tenderness, septal deviation or nasal septal hematoma.  No foreign bodies. Right sinus exhibits no maxillary sinus tenderness and no frontal sinus tenderness. Left sinus exhibits no maxillary sinus tenderness and no frontal sinus tenderness.  Mouth/Throat:    The patient has normal phonation and is in control of secretions. No stridor.  Midline uvula without edema. Soft palate rises symmetrically.  No tonsillar erythema or exudates. No PTA. Tongue protrusion is normal. No trismus.  Patient has very poor dentition.  There is several  missing teeth and several teeth with cavities.  There is no gingival erythema or fluctuance noted.  No floor edema.  No crepitus on neck palpation.  No facial or neck swelling. Mucus membranes moist.  Eyes: Conjunctivae, EOM and lids are normal. Pupils are equal, round, and reactive to light. Right eye exhibits no discharge. Left eye exhibits no discharge. Right conjunctiva is not injected. Left conjunctiva is not injected.  Neck: Trachea normal, normal range of motion, full passive range of motion without pain and phonation normal. Neck supple. No spinous process tenderness and no muscular tenderness present. No neck rigidity. No tracheal deviation and normal range of motion present.  No nuchal rigidity or meningismus  Pulmonary/Chest: Effort normal.  Abdominal: Soft.  Lymphadenopathy:       Head (right side): No submental, no submandibular, no tonsillar, no preauricular, no posterior auricular and no occipital adenopathy present.       Head (left side): No submental, no submandibular, no tonsillar, no preauricular, no posterior auricular and no occipital adenopathy present.    He has no cervical adenopathy.  Neurological: He is alert.  Skin: Skin is warm and dry. No rash noted.  Psychiatric: He has a normal mood and affect.  Nursing note and vitals reviewed.    ED Treatments / Results  Labs (all labs ordered are listed, but only abnormal results are displayed) Labs Reviewed - No data to display  EKG  EKG Interpretation None       Radiology No results found.  Procedures Procedures (including critical care time)  Medications Ordered in ED Medications - No data to display   Initial Impression / Assessment and Plan / ED Course  I have reviewed the triage vital signs and the nursing notes.  Pertinent labs & imaging results that were available during my care of the patient were reviewed by me and considered in my medical decision making (see chart for details).     Patient  with dental pain.  No gross abscess.  Exam unconcerning for Ludwig's angina or spread of infection.  Patient has a penicillin allergy.  Pathway suggest the patient is now acceptable for cephalosporin use.  Will prescribe clindamycin.  On chart review the patient's kidney function on 04/08/2017 showed no evidence of kidney disease and the patient denies any history of GI bleeds.  Will treat with anti-inflammatory medications, viscous lidocaine and urge patient to follow-up with his dentist during scheduled appointment.  Will provide dental resources.  Patient was offered dental block in the emergency department but denied. Specific return precautions discussed. Time was given for all questions to be answered. The patient verbalized understanding and agreement with plan. The patient appears safe for discharge home.  Final Clinical Impressions(s) / ED Diagnoses   Final diagnoses:  Pain, dental    ED Discharge Orders        Ordered    naproxen (NAPROSYN) 500 MG tablet  2 times daily     07/24/17 1724  lidocaine (XYLOCAINE) 2 % solution  As needed     07/24/17 1724    clindamycin (CLEOCIN) 150 MG capsule  3 times daily     07/24/17 1724       Princella Pellegrini 07/24/17 1724    Jacalyn Lefevre, MD 07/24/17 1731

## 2017-07-24 NOTE — Discharge Instructions (Signed)
Please read and follow all provided instructions.  Your diagnoses today include:  1. Pain, dental     The exam and treatment you received today has been provided on an emergency basis only. This is not a substitute for complete medical or dental care. This problem will not resolve on its own without the care of a dentist.  Tests performed today include: 1. Vital signs. See below for your results today.   Medications prescribed:   Take any prescribed medications only as directed. Use ibuprofen or naproxen for pain. Use the viscous lidocaine for mouth pain. Swish with the lidocaine and spit it out. Do not swallow it.  Please take all of your antibiotics until finished!   You may develop abdominal discomfort or diarrhea from the antibiotic.  You may help offset this with probiotics which you can buy or get in yogurt. Do not eat or take the probiotics until 2 hours after your antibiotic. Do not take your medicine if develop an itchy rash, swelling in your mouth or lips, or difficulty breathing.    Home care instructions:  Follow any educational materials contained in this packet.  Follow-up instructions: Please follow-up with your dentist for further evaluation of your symptoms.   Please Contact this number: [phone] to get into contact with an emergency dental service to schedule an appointment with a local dentist.   Dental Assistance: See below for dental referrals  Return instructions:  1. Please return to the Emergency Department if you experience worsening symptoms. 2. Please return if you develop a fever, you develop more swelling in your face or neck, you have trouble breathing or swallowing food. 3. Please return if you have any other emergent concerns.  Additional Information:  Your vital signs today were: BP 137/88 (BP Location: Right Arm)    Pulse 63    Temp 98.6 F (37 C) (Oral)    Resp 14    Ht 5\' 11"  (1.803 m)    Wt 99.8 kg (220 lb)    SpO2 99%    BMI 30.68 kg/m   If your blood pressure (BP) was elevated above 135/85 this visit, please have this repeated by your doctor within one month. --------------

## 2017-12-05 ENCOUNTER — Encounter (HOSPITAL_COMMUNITY): Payer: Self-pay | Admitting: Emergency Medicine

## 2017-12-05 ENCOUNTER — Emergency Department (HOSPITAL_COMMUNITY)
Admission: EM | Admit: 2017-12-05 | Discharge: 2017-12-06 | Disposition: A | Discharge status: Home / Self Care | Payer: Self-pay | Attending: Emergency Medicine | Admitting: Emergency Medicine

## 2017-12-05 ENCOUNTER — Other Ambulatory Visit: Payer: Self-pay

## 2017-12-05 DIAGNOSIS — T63441A Toxic effect of venom of bees, accidental (unintentional), initial encounter: Secondary | ICD-10-CM | POA: Insufficient documentation

## 2017-12-05 DIAGNOSIS — T782XXA Anaphylactic shock, unspecified, initial encounter: Secondary | ICD-10-CM

## 2017-12-05 DIAGNOSIS — I509 Heart failure, unspecified: Secondary | ICD-10-CM | POA: Insufficient documentation

## 2017-12-05 DIAGNOSIS — Z79899 Other long term (current) drug therapy: Secondary | ICD-10-CM | POA: Insufficient documentation

## 2017-12-05 MED ORDER — EPINEPHRINE 0.3 MG/0.3ML IJ SOAJ
INTRAMUSCULAR | Status: AC
  Filled 2017-12-05: qty 0.3

## 2017-12-05 MED ORDER — EPINEPHRINE 0.3 MG/0.3ML IJ SOAJ: 0.3000 mg | Freq: Once | INTRAMUSCULAR | 1 refills | Status: AC

## 2017-12-05 MED ORDER — METHYLPREDNISOLONE SODIUM SUCC 125 MG IJ SOLR
125.0000 mg | Freq: Once | INTRAMUSCULAR | Status: AC
  Administered 2017-12-05: 125 mg via INTRAVENOUS
  Filled 2017-12-05: qty 2

## 2017-12-05 MED ORDER — EPINEPHRINE 0.3 MG/0.3ML IJ SOAJ
0.3000 mg | Freq: Once | INTRAMUSCULAR | Status: AC
  Administered 2017-12-05: 0.3 mg via INTRAMUSCULAR

## 2017-12-05 MED ORDER — FAMOTIDINE IN NACL 20-0.9 MG/50ML-% IV SOLN
20.0000 mg | Freq: Once | INTRAVENOUS | Status: AC
  Administered 2017-12-05: 20 mg via INTRAVENOUS
  Filled 2017-12-05: qty 50

## 2017-12-05 MED ORDER — SODIUM CHLORIDE 0.9 % IV BOLUS
1000.0000 mL | Freq: Once | INTRAVENOUS | Status: AC
  Administered 2017-12-05: 1000 mL via INTRAVENOUS

## 2017-12-05 MED ORDER — DIPHENHYDRAMINE HCL 25 MG PO TABS: 25.0000 mg | ORAL_TABLET | Freq: Four times a day (QID) | ORAL | 0 refills | Status: DC

## 2017-12-05 MED ORDER — PREDNISONE 20 MG PO TABS: 40.0000 mg | ORAL_TABLET | Freq: Every day | ORAL | 0 refills | Status: DC

## 2017-12-05 NOTE — ED Provider Notes (Signed)
[facility] EMERGENCY DEPARTMENT Provider Note   CSN: [id number] Arrival date & time: 12/05/17  [id number]     History   Chief Complaint Chief Complaint  Patient presents with  . Allergic Reaction    HPI Martin Lin is a 42 y.o. male.  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  Patient report he was trimming the bushes prior to arrival when he was stung by bee to his right forehead.  Incident happened approximately 20 minutes ago.  He immediately took 50 mg of Benadryl and came here.  He is now noticing some mild throat irritation but otherwise no other symptoms.  Does complain some itchiness to the body and pain to his forehead at the stung site.  No report of lightheadedness, dizziness, tongue swelling, trouble breathing, wheezing or abdominal cramping.  He mention been stung a week ago and at that time he used his last EpiPen and now he does not have one available.  He reports being hospitalized for severe anaphylaxis reaction to bee stings in the past.  Past Medical History:  Diagnosis Date  . CHF (congestive heart failure) (HCC)     There are no active problems to display for this patient.   Past Surgical History:  Procedure Laterality Date  . FRACTURE SURGERY          Home Medications    Prior to Admission medications   Medication Sig Start Date End Date Taking? Authorizing Provider  diphenhydrAMINE (BENADRYL) 25 mg capsule Take 50 mg by mouth every 6 (six) hours as needed.    [provider]  fluticasone (FLONASE) 50 MCG/ACT nasal spray Place 1 spray into both nostrils daily. Patient not taking: Reported on 02/13/2017 01/21/17   Michela PitcherFawze, Mina A, PA-C  ibuprofen (ADVIL,MOTRIN) 200 MG tablet Take 200 mg by mouth every 6 (six) hours as needed for headache.    [provider]  lidocaine (XYLOCAINE) 2 % solution Use as directed 15 mLs in the mouth or throat as  needed for mouth pain. 07/24/17   Maczis, Elmer SowMichael M, PA-C  naproxen (NAPROSYN) 500 MG tablet Take 1 tablet (500 mg total) by mouth 2 (two) times daily. 07/24/17   Maczis, Elmer SowMichael M, PA-C  ondansetron (ZOFRAN) 8 MG tablet Take 1 tablet (8 mg total) by mouth every 8 (eight) hours as needed for nausea or vomiting. 04/08/17   Mancel BaleWentz, Elliott, MD  predniSONE (DELTASONE) 20 MG tablet Take 2 tablets (40 mg total) by mouth daily with breakfast. 02/13/17   Rise MuLeaphart, Kenneth T, PA-C    Family History No family history on file.  Social History Social History   Tobacco Use  . Smoking status: Never Smoker  . Smokeless tobacco: Never Used  Substance Use Topics  . Alcohol use: No  . Drug use: No     Allergies   Bee pollen-1000-royal jelly [nutritional supplements]; Penicillins; and Tramadol   Review of Systems Review of Systems  All other systems reviewed and are negative.    Physical Exam Updated Vital Signs BP (!) 142/95 (BP Location: Right Arm)   Pulse 67   Temp 98.1 F (36.7 C) (Oral)   Resp 18   SpO2 98%   Physical Exam  Constitutional: He is oriented to person, place, and time. He appears well-developed and well-nourished. No distress.  HENT:  Head: Atraumatic.  Localized skin irritation noted to right forehead from a recent bee sting without stinger present.  It is mildly tender.  Mouth without trismus, normal-appearing tongue, no edema noted.  Eyes: Conjunctivae are normal.  Neck: Neck supple.  Cardiovascular: Normal rate and regular rhythm.  Pulmonary/Chest: Effort normal and breath sounds normal. He has no wheezes.  Abdominal: Soft. He exhibits no distension. There is no tenderness.  Musculoskeletal: He exhibits no edema.  Neurological: He is alert and oriented to person, place, and time.  Skin: No rash noted.  Psychiatric: He has a normal mood and affect.  Nursing note and vitals reviewed.    ED Treatments / Results  Labs (all labs ordered are listed, but only  abnormal results are displayed) Labs Reviewed - No data to display  EKG None  Radiology No results found.  Procedures Procedures (including critical care time)  Medications Ordered in ED Medications  EPINEPHrine (EPI-PEN) injection 0.3 mg (0.3 mg Intramuscular Given 12/05/17 0940)  methylPREDNISolone sodium succinate (SOLU-MEDROL) 125 mg/2 mL injection 125 mg (125 mg Intravenous Given 12/05/17 0943)  famotidine (PEPCID) IVPB 20 mg premix (0 mg Intravenous Stopped 12/05/17 1033)  sodium chloride 0.9 % bolus 1,000 mL (0 mLs Intravenous Stopped 12/05/17 1303)     Initial Impression / Assessment and Plan / ED Course  I have reviewed the triage vital signs and the nursing notes.  Pertinent labs & imaging results that were available during my care of the patient were reviewed by me and considered in my medical decision making (see chart for details).     BP 140/89   Pulse 63   Temp 98.1 F (36.7 C) (Oral)   Resp 18   Ht 5\' 11"  (1.803 m)   Wt 104.3 kg (230 lb)   SpO2 98%   BMI 32.08 kg/m    Final Clinical Impressions(s) / ED Diagnoses   Final diagnoses:  Anaphylaxis, initial encounter  Bee sting, accidental or unintentional, initial encounter    ED Discharge Orders        Ordered    predniSONE (DELTASONE) 20 MG tablet  Daily with breakfast     12/05/17 1431    EPINEPHrine (EPIPEN 2-PAK) 0.3 mg/0.3 mL IJ SOAJ injection   Once     12/05/17 1431    diphenhydrAMINE (BENADRYL) 25 MG tablet  Every 6 hours     12/05/17 1431     9:40 AM Patient with 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  He took 50 of Benadryl prior to arrival.  He does complain of some tightness in his throat.  No urticaria rash at this time.  His vital signs stable, no hypotension.  Given significant history of anaphylaxis reaction, patient will receive EpiPen, Pepcid, Solu-Medrol, and IV fluid.  Will monitor closely.  2:12 PM Patient  has been monitored for the past 4 hours without any concerning side effect.  He felt much better.  He is stable for discharge.  Patient discharged home with an EpiPen prescription as well as close follow-up and return precaution.  CRITICAL CARE Performed by: [name] Total critical care time: 35 minutes Critical care time was exclusive of separately billable procedures and treating other patients. Critical care was necessary to treat or prevent imminent or life-threatening deterioration. Critical care was time spent personally by me on the following activities: development of treatment plan with patient and/or surrogate as well as nursing, discussions with consultants, evaluation of patient's response to treatment, examination of patient, obtaining history from patient or surrogate, ordering and performing treatments  and interventions, ordering and review of laboratory studies, ordering and review of radiographic studies, pulse oximetry and re-evaluation of patient's condition.    [name], PA-C 12/05/17 1432    Cathren Laine, MD 12/05/17 [phone]

## 2017-12-05 NOTE — ED Notes (Signed)
Patient verbalized understanding of discharge instructions and denies any further needs or questions at this time. VS stable. Patient ambulatory with steady gait.  

## 2017-12-05 NOTE — ED Triage Notes (Signed)
Patient to ED c/o bee sting to R side forehead while working outside about 30 minutes ago. Patient states he is allergic to bee stings and is out of his epi pens. Took 50mg  PO benadryl PTA. Airway intact, patient does endorse some shortness of breath, resp e/u.

## 2017-12-05 NOTE — ED Triage Notes (Signed)
Patient presents today with complaints of allergeric reaction. Patient reports he was stung by a bee on left side of head. Patient reports he normally has epi pens but used the last one Monday. Patient reports he has some throat swelling. Patient reports he took two benadryl before coming. Patient reports some itching and some tongue swelling.

## 2018-08-13 ENCOUNTER — Encounter (HOSPITAL_COMMUNITY): Payer: Self-pay | Admitting: *Deleted

## 2018-08-13 ENCOUNTER — Emergency Department (HOSPITAL_COMMUNITY)
Admission: EM | Admit: 2018-08-13 | Discharge: 2018-08-13 | Disposition: A | Discharge status: Home / Self Care | Payer: Self-pay | Attending: Emergency Medicine | Admitting: Emergency Medicine

## 2018-08-13 DIAGNOSIS — Z79899 Other long term (current) drug therapy: Secondary | ICD-10-CM | POA: Insufficient documentation

## 2018-08-13 DIAGNOSIS — I509 Heart failure, unspecified: Secondary | ICD-10-CM | POA: Insufficient documentation

## 2018-08-13 DIAGNOSIS — K0889 Other specified disorders of teeth and supporting structures: Secondary | ICD-10-CM

## 2018-08-13 DIAGNOSIS — K047 Periapical abscess without sinus: Secondary | ICD-10-CM | POA: Insufficient documentation

## 2018-08-13 MED ORDER — NAPROXEN 500 MG PO TABS: 500.0000 mg | ORAL_TABLET | Freq: Two times a day (BID) | ORAL | 0 refills | Status: DC

## 2018-08-13 MED ORDER — CLINDAMYCIN HCL 150 MG PO CAPS: 450.0000 mg | ORAL_CAPSULE | Freq: Three times a day (TID) | ORAL | 0 refills | Status: AC

## 2018-08-13 MED ORDER — HYDROCODONE-ACETAMINOPHEN 5-325 MG PO TABS
2.0000 | ORAL_TABLET | Freq: Once | ORAL | Status: AC
  Administered 2018-08-13: 2 via ORAL
  Filled 2018-08-13: qty 2

## 2018-08-13 MED ORDER — CLINDAMYCIN HCL 150 MG PO CAPS
450.0000 mg | ORAL_CAPSULE | Freq: Once | ORAL | Status: AC
  Administered 2018-08-13: 450 mg via ORAL
  Filled 2018-08-13: qty 3

## 2018-08-13 NOTE — ED Triage Notes (Signed)
Pt in c/o dental pain for the last few days, no distress noted

## 2018-08-13 NOTE — ED Provider Notes (Signed)
[facility] EMERGENCY DEPARTMENT Provider Note   CSN: [id number] Arrival date & time: 08/13/18  1513    History   Chief Complaint Chief Complaint  Patient presents with  . Dental Pain    HPI Martin Lin is a 43 y.o. male with PMH/o CHF who presents for evaluation of right lower dental pain that is been ongoing for last 5 weeks.  He states that pain worsened in the last few days.  He states that he feels like the pain is now radiating to his right jaw.  He states that it hurts whenever he tries to bite or chew and states he has had difficulty eating secondary to pain.  He still been able tolerate secretions without any difficulty.  He states he had some leftover ciprofloxacin so he took those antibiotics with no improvement.  Patient also reports he has been taking Tylenol and ibuprofen.  He has not noted any fever.  He states he does not have a dentist.  Patient denies any difficulty breathing, vomiting.     The history is provided by the patient.    Past Medical History:  Diagnosis Date  . CHF (congestive heart failure) (HCC)     There are no active problems to display for this patient.   Past Surgical History:  Procedure Laterality Date  . FRACTURE SURGERY          Home Medications    Prior to Admission medications   Medication Sig Start Date End Date Taking? Authorizing Provider  clindamycin (CLEOCIN) 150 MG capsule Take 3 capsules (450 mg total) by mouth 3 (three) times daily for 7 days. 08/13/18 08/20/18  Maxwell Caul, PA-C  diphenhydrAMINE (BENADRYL) 25 MG tablet Take 1 tablet (25 mg total) by mouth every 6 (six) hours. 12/05/17   [name], PA-C  ibuprofen (ADVIL,MOTRIN) 200 MG tablet Take 200 mg by mouth every 6 (six) hours as needed for headache.    [provider]  naproxen (NAPROSYN) 500 MG tablet Take 1 tablet (500 mg total) by mouth 2 (two) times daily. 08/13/18   Maxwell Caul, PA-C  predniSONE (DELTASONE) 20 MG tablet  Take 2 tablets (40 mg total) by mouth daily with breakfast. 12/05/17   [name], PA-C    Family History History reviewed. No pertinent family history.  Social History Social History   Tobacco Use  . Smoking status: Never Smoker  . Smokeless tobacco: Never Used  Substance Use Topics  . Alcohol use: No  . Drug use: No     Allergies   Bee pollen-1000-royal jelly [nutritional supplements]; Penicillins; and Tramadol   Review of Systems Review of Systems  Constitutional: Negative for fever.  HENT: Positive for dental problem. Negative for facial swelling.   Respiratory: Negative for shortness of breath.   All other systems reviewed and are negative.    Physical Exam Updated Vital Signs BP (!) 139/104 (BP Location: Right Arm)   Pulse 74   Temp 98.4 F (36.9 C) (Oral)   Resp 18   SpO2 100%   Physical Exam Vitals signs and nursing note reviewed.  Constitutional:      Appearance: He is well-developed.  HENT:     Head: Normocephalic and atraumatic.     Comments: Face is symmetric in appearance without any overlying warmth, erythema, edema.    Mouth/Throat:     Dentition: Abnormal dentition. Dental caries present. No dental abscesses.     Pharynx: Oropharynx is clear.  Comments: Airway is patent, phonation is intact. Uvula is midline.  No trismus.  Partially cracked tooth with some surrounding gingival erythema.  No edema.  No fluctuance.  No obvious identifiable dental abscess.  No swelling to floor of mouth. Eyes:     General: No scleral icterus.       Right eye: No discharge.        Left eye: No discharge.     Conjunctiva/sclera: Conjunctivae normal.  Pulmonary:     Effort: Pulmonary effort is normal.  Skin:    General: Skin is warm and dry.  Neurological:     Mental Status: He is alert.  Psychiatric:        Speech: Speech normal.        Behavior: Behavior normal.      ED Treatments / Results  Labs (all labs ordered are listed, but only abnormal  results are displayed) Labs Reviewed - No data to display  EKG None  Radiology No results found.  Procedures Procedures (including critical care time)  Medications Ordered in ED Medications  HYDROcodone-acetaminophen (NORCO/VICODIN) 5-325 MG per tablet 2 tablet (2 tablets Oral Given 08/13/18 1710)  clindamycin (CLEOCIN) capsule 450 mg (450 mg Oral Given 08/13/18 1719)     Initial Impression / Assessment and Plan / ED Course  I have reviewed the triage vital signs and the nursing notes.  Pertinent labs & imaging results that were available during my care of the patient were reviewed by me and considered in my medical decision making (see chart for details).        43 y.o. M presents with 5 weeks of of dental pain. No evidence of abscess requiring immediate incision and drainage. Exam not concerning for Ludwig's angina or pharyngeal abscess. Will treat with antibiotics and given penicillin allergy. Patient instructed to follow-up with dentist referral provided. Stable for discharge at this time. Strict return precautions discussed. Patient expresses understanding and agreement to plan.  Portions of this note were generated with Scientist, clinical (histocompatibility and immunogenetics). Dictation errors may occur despite best attempts at proofreading.   Final Clinical Impressions(s) / ED Diagnoses   Final diagnoses:  Pain, dental  Dental abscess    ED Discharge Orders         Ordered    clindamycin (CLEOCIN) 150 MG capsule  3 times daily     08/13/18 1714    naproxen (NAPROSYN) 500 MG tablet  2 times daily     08/13/18 1715           Rosana Hoes 08/13/18 1721    Cathren Laine, MD 08/13/18 [phone]

## 2018-08-13 NOTE — ED Notes (Signed)
Patient verbalizes understanding of discharge instructions. Opportunity for questioning and answers were provided. Armband removed by staff, pt discharged from ED ambulatory to home.  

## 2018-08-13 NOTE — Discharge Instructions (Signed)
Take antibiotics as directed. Please take all of your antibiotics until finished.  You can take Tylenol or Ibuprofen as directed for pain. You can alternate Tylenol and Ibuprofen every 4 hours. If you take Tylenol at 1pm, then you can take Ibuprofen at 5pm. Then you can take Tylenol again at 9pm.   The exam and treatment you received today has been provided on an emergency basis only. This is not a substitute for complete medical or dental care. If your problem worsens or new symptoms (problems) appear, and you are unable to arrange prompt follow-up care with your dentist, call or return to this location. If you do not have a dentist, please follow-up with one on the list provided  CALL YOUR DENTIST OR RETURN IMMEDIATELY IF you develop a fever, rash, difficulty breathing or swallowing, neck or facial swelling, or other potentially serious concerns.   Please follow-up with one of the dental clinics provided to you below or in your paperwork. Call and tell them you were seen in the Emergency Dept and arrange for an appointment. You may have to call multiple places in order to find a place to be seen.  Dental Assistance If the dentist on-call cannot see you, please use the resources below:   Patients with Medicaid: [facility] [address], [phone], [phone]  If unable to pay, or uninsured, contact [facility] ([phone]) or [facility] ([phone] in [place], [phone] in [place]) to become qualified for the adult dental clinic  Other Low-Cost Community Dental Services: [facility]- [address], [place], [place], [address]    [phone], Ext. [phone]    2nd and 4th Thursday of the month at 6:30am    10 clients each day by appointment, can sometimes see walk-in     patients if someone does not show for an appointment [facility]- [address], [place], [place], [address]    [phone]  [facility]- [address], [place], [place], [address]    [phone]  [facility]- [phone] [facility]- [phone] [facility]- [phone]  

## 2018-12-28 ENCOUNTER — Encounter (HOSPITAL_COMMUNITY): Payer: Self-pay | Admitting: Emergency Medicine

## 2018-12-28 ENCOUNTER — Emergency Department (HOSPITAL_COMMUNITY): Payer: Self-pay

## 2018-12-28 ENCOUNTER — Other Ambulatory Visit: Payer: Self-pay

## 2018-12-28 ENCOUNTER — Emergency Department (HOSPITAL_COMMUNITY)
Admission: EM | Admit: 2018-12-28 | Discharge: 2018-12-28 | Disposition: A | Discharge status: Home / Self Care | Payer: Self-pay | Attending: Emergency Medicine | Admitting: Emergency Medicine

## 2018-12-28 DIAGNOSIS — S60222A Contusion of left hand, initial encounter: Secondary | ICD-10-CM | POA: Insufficient documentation

## 2018-12-28 DIAGNOSIS — Y939 Activity, unspecified: Secondary | ICD-10-CM | POA: Insufficient documentation

## 2018-12-28 DIAGNOSIS — I509 Heart failure, unspecified: Secondary | ICD-10-CM | POA: Insufficient documentation

## 2018-12-28 DIAGNOSIS — Y929 Unspecified place or not applicable: Secondary | ICD-10-CM | POA: Insufficient documentation

## 2018-12-28 DIAGNOSIS — Y999 Unspecified external cause status: Secondary | ICD-10-CM | POA: Insufficient documentation

## 2018-12-28 MED ORDER — ACETAMINOPHEN 325 MG PO TABS
650.0000 mg | ORAL_TABLET | Freq: Once | ORAL | Status: AC
  Administered 2018-12-28: 650 mg via ORAL
  Filled 2018-12-28: qty 2

## 2018-12-28 MED ORDER — IBUPROFEN 600 MG PO TABS: 600.0000 mg | ORAL_TABLET | Freq: Four times a day (QID) | ORAL | 0 refills | Status: DC | PRN

## 2018-12-28 MED ORDER — ACETAMINOPHEN 325 MG PO TABS: 650.0000 mg | ORAL_TABLET | Freq: Four times a day (QID) | ORAL | 0 refills | Status: DC | PRN
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
## 2018-12-28 NOTE — Discharge Instructions (Signed)
SEE THE HAND DOCTOR IN 1 WEEK. NO FRACTURE ON X-RAYS.

## 2018-12-28 NOTE — ED Notes (Signed)
Pt refusing an Ice pack at this time.

## 2019-03-05 ENCOUNTER — Ambulatory Visit (HOSPITAL_COMMUNITY)
Admission: EM | Admit: 2019-03-05 | Discharge: 2019-03-05 | Disposition: A | Discharge status: Home / Self Care | Payer: Self-pay | Attending: Family Medicine | Admitting: Family Medicine

## 2019-03-05 ENCOUNTER — Encounter (HOSPITAL_COMMUNITY): Payer: Self-pay | Admitting: Emergency Medicine

## 2019-03-05 ENCOUNTER — Other Ambulatory Visit: Payer: Self-pay

## 2019-03-05 DIAGNOSIS — K047 Periapical abscess without sinus: Secondary | ICD-10-CM

## 2019-03-05 MED ORDER — AMOXICILLIN 875 MG PO TABS: 875.0000 mg | ORAL_TABLET | Freq: Two times a day (BID) | ORAL | 2 refills | Status: DC

## 2019-03-05 NOTE — ED Provider Notes (Signed)
[facility]    CSN: [id number] Arrival date & time: 03/05/19  1655      History   Chief Complaint Chief Complaint  Patient presents with  . Dental Pain    HPI Martin Lin is a 43 y.o. male.   This is a 43 year old man who is making [facility] over [facility].  He is here for a dental problem.  Pt states he has tried several times to get an appointment with a dentist for his tooth, but each time he tries to go his infection comes back and they wont do the surgery.  Patient has taken clindamycin, but more recently has done well with amoxicillin and prefers to take the amoxicillin.  He realizes that he has a history of penicillin allergy but the amoxicillin gave him no trouble.     Past Medical History:  Diagnosis Date  . CHF (congestive heart failure) ([facility])     There are no active problems to display for this patient.   Past Surgical History:  Procedure Laterality Date  . FRACTURE SURGERY         Home Medications    Prior to Admission medications   Medication Sig Start Date End Date Taking? Authorizing Provider  amoxicillin (AMOXIL) 875 MG tablet Take 1 tablet (875 mg total) by mouth 2 (two) times daily. 03/05/19   Elvina SidleLauenstein, Kell Ferris, MD  diphenhydrAMINE (BENADRYL) 25 MG tablet Take 1 tablet (25 mg total) by mouth every 6 (six) hours. 12/05/17 03/05/19  Fayrene Helperran, [place], PA-C    Family History History reviewed. No pertinent family history.  Social History Social History   Tobacco Use  . Smoking status: Never Smoker  . Smokeless tobacco: Never Used  Substance Use Topics  . Alcohol use: No  . Drug use: No     Allergies   Bee pollen-1000-royal jelly [nutritional supplements], Penicillins, and Tramadol   Review of Systems Review of Systems   Physical Exam Triage Vital Signs ED Triage Vitals  Enc Vitals Group     BP 03/05/19 1715 (!) 169/95     Pulse Rate 03/05/19 1715 76     Resp 03/05/19 1715 17     Temp  03/05/19 1715 98.6 F (37 C)     Temp Source 03/05/19 1715 Tympanic     SpO2 03/05/19 1715 97 %     Weight --      Height --      Head Circumference --      Peak Flow --      Pain Score 03/05/19 1716 8     Pain Loc --      Pain Edu? --      Excl. in GC? --    No data found.  Updated Vital Signs BP (!) 169/95 (BP Location: Right Arm)   Pulse 76   Temp 98.6 F (37 C) (Tympanic)   Resp 17   SpO2 97%      Physical Exam Vitals signs and nursing note reviewed.  Constitutional:      Appearance: Normal appearance.  HENT:     Head: Normocephalic.     Mouth/Throat:     Mouth: Mucous membranes are moist.     Comments: Diffuse caries Swollen 5 mm nodule over tooth #21 at jaw Neck:     Musculoskeletal: Normal range of motion and neck supple.  Pulmonary:     Effort: Pulmonary effort is normal.  Musculoskeletal: Normal range of motion.  Skin:  General: Skin is warm and dry.  Neurological:     General: No focal deficit present.     Mental Status: He is alert.  Psychiatric:        Mood and Affect: Mood normal.        Behavior: Behavior normal.        UC Treatments / Results  Labs (all labs ordered are listed, but only abnormal results are displayed) Labs Reviewed - No data to display  EKG   Radiology No results found.  Procedures Procedures (including critical care time)  Medications Ordered in UC Medications - No data to display  Initial Impression / Assessment and Plan / UC Course  I have reviewed the triage vital signs and the nursing notes.  Pertinent labs & imaging results that were available during my care of the patient were reviewed by me and considered in my medical decision making (see chart for details).    Final Clinical Impressions(s) / UC Diagnoses   Final diagnoses:  Dental abscess   Discharge Instructions   None    ED Prescriptions    Medication Sig Dispense Auth. Provider   amoxicillin (AMOXIL) 875 MG tablet Take 1 tablet (875  mg total) by mouth 2 (two) times daily. 20 tablet Robyn Haber, MD     I have reviewed the PDMP during this encounter.   Robyn Haber, MD 03/05/19 1732

## 2019-03-05 NOTE — ED Triage Notes (Signed)
Pt states he has tried several times to get an appointment with a dentist for his tooth, but each time he tries to go his infection comes back and they wont do the surgery.

## 2019-11-09 ENCOUNTER — Encounter (HOSPITAL_COMMUNITY): Payer: Self-pay

## 2019-11-09 ENCOUNTER — Other Ambulatory Visit: Payer: Self-pay

## 2019-11-09 ENCOUNTER — Emergency Department (HOSPITAL_COMMUNITY)
Admission: EM | Admit: 2019-11-09 | Discharge: 2019-11-09 | Discharge status: Against Medical Advice | Payer: Self-pay | Attending: Emergency Medicine | Admitting: Emergency Medicine

## 2019-11-09 DIAGNOSIS — Z5321 Procedure and treatment not carried out due to patient leaving prior to being seen by health care provider: Secondary | ICD-10-CM | POA: Insufficient documentation

## 2019-11-09 DIAGNOSIS — J029 Acute pharyngitis, unspecified: Secondary | ICD-10-CM | POA: Insufficient documentation

## 2019-11-09 LAB — GROUP A STREP BY PCR: Group A Strep by PCR: DETECTED — AB

## 2019-11-09 NOTE — ED Triage Notes (Signed)
Patient complains of sore throat and congestion x 5 days, using otc meds with no relief

## 2019-11-09 NOTE — ED Notes (Signed)
Patient stated that he was leaving, and would seek care tomorrow.

## 2019-11-10 ENCOUNTER — Encounter (HOSPITAL_COMMUNITY): Payer: Self-pay | Admitting: Emergency Medicine

## 2019-11-10 ENCOUNTER — Encounter (HOSPITAL_COMMUNITY): Payer: Self-pay | Admitting: Orthopedic Surgery

## 2019-11-10 ENCOUNTER — Other Ambulatory Visit: Payer: Self-pay

## 2019-11-10 ENCOUNTER — Ambulatory Visit (HOSPITAL_COMMUNITY)
Admission: EM | Admit: 2019-11-10 | Discharge: 2019-11-10 | Disposition: A | Discharge status: Home / Self Care | Payer: HRSA Program | Attending: Emergency Medicine | Admitting: Emergency Medicine

## 2019-11-10 ENCOUNTER — Emergency Department (HOSPITAL_COMMUNITY)
Admission: EM | Admit: 2019-11-10 | Discharge: 2019-11-10 | Disposition: A | Discharge status: Home / Self Care | Payer: Self-pay | Attending: Emergency Medicine | Admitting: Emergency Medicine

## 2019-11-10 DIAGNOSIS — Z885 Allergy status to narcotic agent status: Secondary | ICD-10-CM | POA: Diagnosis not present

## 2019-11-10 DIAGNOSIS — J020 Streptococcal pharyngitis: Secondary | ICD-10-CM | POA: Diagnosis not present

## 2019-11-10 DIAGNOSIS — I509 Heart failure, unspecified: Secondary | ICD-10-CM | POA: Insufficient documentation

## 2019-11-10 DIAGNOSIS — Z880 Allergy status to penicillin: Secondary | ICD-10-CM | POA: Diagnosis not present

## 2019-11-10 DIAGNOSIS — Z20822 Contact with and (suspected) exposure to covid-19: Secondary | ICD-10-CM | POA: Insufficient documentation

## 2019-11-10 DIAGNOSIS — Z5321 Procedure and treatment not carried out due to patient leaving prior to being seen by health care provider: Secondary | ICD-10-CM | POA: Insufficient documentation

## 2019-11-10 DIAGNOSIS — J029 Acute pharyngitis, unspecified: Secondary | ICD-10-CM | POA: Insufficient documentation

## 2019-11-10 MED ORDER — AMOXICILLIN 500 MG PO CAPS: 500.0000 mg | ORAL_CAPSULE | Freq: Two times a day (BID) | ORAL | 0 refills | Status: AC

## 2019-11-10 NOTE — ED Provider Notes (Signed)
[facility]    CSN: [id number] Arrival date & time: 11/10/19  1143      History   Chief Complaint Chief Complaint  Patient presents with  . Sore Throat    HPI Martin Lin is a 44 y.o. male history of CHF presenting today for evaluation of sore throat.  Patient has had a sore throat since Thursday over the past 5 days.  Since has felt overall sensation in his throat.  He denies any other associated URI symptoms-denies congestion, rhinorrhea or cough.  Denies fevers.  Denies GI symptoms.  On chart review patient try to go to the emergency room in the past 2 days for the same symptoms, had strep test done which was positive.  HPI  Past Medical History:  Diagnosis Date  . CHF (congestive heart failure) ([facility])     There are no problems to display for this patient.   Past Surgical History:  Procedure Laterality Date  . FRACTURE SURGERY         Home Medications    Prior to Admission medications   Medication Sig Start Date End Date Taking? Authorizing Provider  amoxicillin (AMOXIL) 500 MG capsule Take 1 capsule (500 mg total) by mouth 2 (two) times daily for 10 days. 11/10/19 11/20/19  Alic Hilburn C, PA-C  diphenhydrAMINE (BENADRYL) 25 MG tablet Take 1 tablet (25 mg total) by mouth every 6 (six) hours. 12/05/17 03/05/19  [name], PA-C    Family History History reviewed. No pertinent family history.  Social History Social History   Tobacco Use  . Smoking status: Never Smoker  . Smokeless tobacco: Never Used  Substance Use Topics  . Alcohol use: No  . Drug use: No     Allergies   Bee pollen-1000-royal jelly [nutritional supplements], Penicillins, and Tramadol   Review of Systems Review of Systems  Constitutional: Negative for activity change, appetite change, chills, fatigue and fever.  HENT: Positive for sore throat. Negative for congestion, ear pain, rhinorrhea, sinus pressure and trouble swallowing.   Eyes: Negative for discharge and  redness.  Respiratory: Negative for cough, chest tightness and shortness of breath.   Cardiovascular: Negative for chest pain.  Gastrointestinal: Negative for abdominal pain, diarrhea, nausea and vomiting.  Musculoskeletal: Negative for myalgias.  Skin: Negative for rash.  Neurological: Negative for dizziness, light-headedness and headaches.     Physical Exam Triage Vital Signs ED Triage Vitals  Enc Vitals Group     BP 11/10/19 1151 (!) 152/98     Pulse Rate 11/10/19 1151 85     Resp 11/10/19 1151 16     Temp 11/10/19 1151 98.2 F (36.8 C)     Temp Source 11/10/19 1151 Oral     SpO2 11/10/19 1151 100 %     Weight --      Height --      Head Circumference --      Peak Flow --      Pain Score 11/10/19 1202 4     Pain Loc --      Pain Edu? --      Excl. in GC? --    No data found.  Updated Vital Signs BP (!) 152/98 (BP Location: Left Arm)   Pulse 85   Temp 98.2 F (36.8 C) (Oral)   Resp 16   SpO2 100%   Visual Acuity Right Eye Distance:   Left Eye Distance:   Bilateral Distance:    Right Eye Near:   Left Eye Near:  Bilateral Near:     Physical Exam Vitals and nursing note reviewed.  Constitutional:      Appearance: He is well-developed.     Comments: No acute distress  HENT:     Head: Normocephalic and atraumatic.     Ears:     Comments: Bilateral ears without tenderness to palpation of external auricle, tragus and mastoid, EAC's without erythema or swelling, TM's with good bony landmarks and cone of light. Non erythematous.     Nose: Nose normal.     Mouth/Throat:     Comments: Oral mucosa pink and moist, no tonsillar enlargement or exudate, does have tonsillar erythema extending onto uvula, no uvula swelling.  Posterior pharynx patent and nonerythematous, no uvula deviation or swelling. Normal phonation. Eyes:     Conjunctiva/sclera: Conjunctivae normal.  Neck:     Comments: Full active range of motion of neck, no lymphadenopathy  palpated Cardiovascular:     Rate and Rhythm: Normal rate.  Pulmonary:     Effort: Pulmonary effort is normal. No respiratory distress.     Comments: Breathing comfortably at rest, CTABL, no wheezing, rales or other adventitious sounds auscultated Abdominal:     General: There is no distension.  Musculoskeletal:        General: Normal range of motion.     Cervical back: Neck supple.  Skin:    General: Skin is warm and dry.  Neurological:     Mental Status: He is alert and oriented to person, place, and time.      UC Treatments / Results  Labs (all labs ordered are listed, but only abnormal results are displayed) Labs Reviewed  SARS CORONAVIRUS 2 (TAT 6-24 HRS)    EKG   Radiology No results found.  Procedures Procedures (including critical care time)  Medications Ordered in UC Medications - No data to display  Initial Impression / Assessment and Plan / UC Course  I have reviewed the triage vital signs and the nursing notes.  Pertinent labs & imaging results that were available during my care of the patient were reviewed by me and considered in my medical decision making (see chart for details).     Strep test positive in emergency room.  Covid PCR pending.  Treating with amoxicillin twice daily x10 days, patient reports that he has previously tolerated amoxicillin despite allergy to penicillins.  Continue symptomatic and supportive care.  Monitor for gradual improvement with the above.  Discussed strict return precautions. Patient verbalized understanding and is agreeable with plan.  Final Clinical Impressions(s) / UC Diagnoses   Final diagnoses:  Strep throat     Discharge Instructions     Sore Throat  Your strep test was positive. We will treat you for strep throat with an antibiotic. Please take Amoxicillin as prescribed.   Please continue Tylenol or Ibuprofen for fever and pain. May try salt water gargles, cepacol lozenges, throat spray, or OTC cold  relief medicine for throat discomfort. If you also have congestion take a daily anti-histamine like Zyrtec, Claritin, and a oral decongestant to help with post nasal drip that may be irritating your throat.   Stay hydrated and drink plenty of fluids to keep your throat coated relieve irritation.    ED Prescriptions    Medication Sig Dispense Auth. Provider   amoxicillin (AMOXIL) 500 MG capsule Take 1 capsule (500 mg total) by mouth 2 (two) times daily for 10 days. 20 capsule Nechelle Petrizzo, [place] C, PA-C     PDMP not reviewed this  encounter.   Lew Dawes, [place] 11/10/19 1237

## 2019-11-10 NOTE — ED Triage Notes (Signed)
Pt presents with sore throat since Thursday. Pt denies fever, n/v/d, body ache, chills, or cough.   Pt taking Nyquil/Dayqil and Aleve OTC with some relief. Pt states his throat feels raw.   Pt tried going to the ED yesterday and this morning and left because there was a long wait .

## 2019-11-10 NOTE — ED Notes (Signed)
Pt stated he was leaving to go to urgent care.

## 2019-11-10 NOTE — Discharge Instructions (Signed)
Sore Throat  Your strep test was positive. We will treat you for strep throat with an antibiotic. Please take Amoxicillin as prescribed.   Please continue Tylenol or Ibuprofen for fever and pain. May try salt water gargles, cepacol lozenges, throat spray, or OTC cold relief medicine for throat discomfort. If you also have congestion take a daily anti-histamine like Zyrtec, Claritin, and a oral decongestant to help with post nasal drip that may be irritating your throat.   Stay hydrated and drink plenty of fluids to keep your throat coated relieve irritation.

## 2019-11-10 NOTE — ED Triage Notes (Signed)
C/o sore throat since last Thursday.  Taking OTC meds without relief.  Came to ED yesterday and left due to wait.

## 2019-11-11 LAB — SARS CORONAVIRUS 2 (TAT 6-24 HRS): SARS Coronavirus 2: NEGATIVE

## 2020-02-08 ENCOUNTER — Other Ambulatory Visit: Payer: Self-pay

## 2020-02-08 ENCOUNTER — Encounter (HOSPITAL_COMMUNITY): Payer: Self-pay

## 2020-02-08 ENCOUNTER — Emergency Department (HOSPITAL_COMMUNITY)
Admission: EM | Admit: 2020-02-08 | Discharge: 2020-02-08 | Disposition: A | Discharge status: Home / Self Care | Payer: Self-pay | Attending: Emergency Medicine | Admitting: Emergency Medicine

## 2020-02-08 DIAGNOSIS — I509 Heart failure, unspecified: Secondary | ICD-10-CM | POA: Insufficient documentation

## 2020-02-08 DIAGNOSIS — R42 Dizziness and giddiness: Secondary | ICD-10-CM | POA: Insufficient documentation

## 2020-02-08 DIAGNOSIS — E860 Dehydration: Secondary | ICD-10-CM | POA: Insufficient documentation

## 2020-02-08 DIAGNOSIS — R55 Syncope and collapse: Secondary | ICD-10-CM | POA: Insufficient documentation

## 2020-02-08 LAB — COMPREHENSIVE METABOLIC PANEL
ALT: 70 U/L — ABNORMAL HIGH (ref 0–44)
AST: 55 U/L — ABNORMAL HIGH (ref 15–41)
Albumin: 4.2 g/dL (ref 3.5–5.0)
Alkaline Phosphatase: 64 U/L (ref 38–126)
Anion gap: 10 (ref 5–15)
BUN: 17 mg/dL (ref 6–20)
CO2: 23 mmol/L (ref 22–32)
Calcium: 9.3 mg/dL (ref 8.9–10.3)
Chloride: 106 mmol/L (ref 98–111)
Creatinine, Ser: 1.18 mg/dL (ref 0.61–1.24)
GFR calc Af Amer: >60 mL/min (ref >60)
GFR calc non Af Amer: >60 mL/min (ref >60)
Glucose, Bld: 108 mg/dL — ABNORMAL HIGH (ref 70–99)
Potassium: 4 mmol/L (ref 3.5–5.1)
Sodium: 139 mmol/L (ref 135–145)
Total Bilirubin: 0.3 mg/dL (ref 0.3–1.2)
Total Protein: 7.7 g/dL (ref 6.5–8.1)

## 2020-02-08 LAB — CBC WITH DIFFERENTIAL/PLATELET
Abs Immature Granulocytes: 0.02 10*3/uL (ref 0.00–0.07)
Basophils Absolute: 0 10*3/uL (ref 0.0–0.1)
Basophils Relative: 1 %
Eosinophils Absolute: 0.4 10*3/uL (ref 0.0–0.5)
Eosinophils Relative: 5 %
HCT: 45.3 % (ref 39.0–52.0)
Hemoglobin: 14.7 g/dL (ref 13.0–17.0)
Immature Granulocytes: 0 %
Lymphocytes Relative: 28 %
Lymphs Abs: 2.2 10*3/uL (ref 0.7–4.0)
MCH: 27.2 pg (ref 26.0–34.0)
MCHC: 32.5 g/dL (ref 30.0–36.0)
MCV: 83.7 fL (ref 80.0–100.0)
Monocytes Absolute: 0.5 10*3/uL (ref 0.1–1.0)
Monocytes Relative: 6 %
Neutro Abs: 4.7 10*3/uL (ref 1.7–7.7)
Neutrophils Relative %: 60 %
Platelets: 271 10*3/uL (ref 150–400)
RBC: 5.41 MIL/uL (ref 4.22–5.81)
RDW: 12.7 % (ref 11.5–15.5)
WBC: 7.9 10*3/uL (ref 4.0–10.5)
nRBC: 0 % (ref 0.0–0.2)

## 2020-02-08 LAB — LACTIC ACID, PLASMA: Lactic Acid, Venous: 1.5 mmol/L (ref 0.5–1.9)

## 2020-02-08 MED ORDER — METOCLOPRAMIDE HCL 5 MG/ML IJ SOLN
10.0000 mg | Freq: Once | INTRAMUSCULAR | Status: AC
  Administered 2020-02-08: 10 mg via INTRAVENOUS
  Filled 2020-02-08: qty 2

## 2020-02-08 MED ORDER — KETOROLAC TROMETHAMINE 15 MG/ML IJ SOLN
15.0000 mg | Freq: Once | INTRAMUSCULAR | Status: AC
  Administered 2020-02-08: 15 mg via INTRAVENOUS
  Filled 2020-02-08: qty 1

## 2020-02-08 MED ORDER — LACTATED RINGERS IV BOLUS
1000.0000 mL | Freq: Once | INTRAVENOUS | Status: AC
  Administered 2020-02-08: 1000 mL via INTRAVENOUS

## 2020-02-08 NOTE — ED Provider Notes (Signed)
[facility] EMERGENCY DEPARTMENT Provider Note   CSN: [id number] Arrival date & time: 02/08/20  1817     History No chief complaint on file.   Martin Lin is a 44 y.o. male.  Patient is a 44 year old male with a prior medical history of congestive heart failure but takes no medications at this time presenting today with near syncope.  Patient reports that at his job he is exposed to the heat most of the time whether it is working outside or in a building without air conditioning.  Patient reports that he tries to drink plenty of fluids but sometimes he does not even get a lunch break.  Today when he got home he had an intense headache behind both eyes and he decided that he was going to go sit down in the living room and cool off while he was waiting for dinner.  Because of the headache he decided to take 1 hydrocodone tablet that he had leftover from a procedure.  Patient's fianc reports that she saw him in the bathroom combing his hair and he was not acting normal.  His legs seem shaky and he was diaphoretic and only partly responsive.  She reports he was not acting himself and she decided to call 911 because he was scaring her.  Patient states he remembers this encounter but there are certain episodes that he does not 100% remember.  He currently reports that the headache is improving.  He denies any chest pain, shortness of breath, abdominal pain.  He has not had fever or URI symptoms.  He is concerned that he may have gotten overheated and reports right now he just feels really hot.  The history is provided by the patient.       Past Medical History:  Diagnosis Date  . CHF (congestive heart failure) (HCC)     There are no problems to display for this patient.   Past Surgical History:  Procedure Laterality Date  . FRACTURE SURGERY         No family history on file.  Social History   Tobacco Use  . Smoking status: Never Smoker  . Smokeless tobacco:  Never Used  Substance Use Topics  . Alcohol use: Yes  . Drug use: No    Home Medications Prior to Admission medications   Medication Sig Start Date End Date Taking? Authorizing Provider  diphenhydrAMINE (BENADRYL) 25 MG tablet Take 1 tablet (25 mg total) by mouth every 6 (six) hours. 12/05/17 03/05/19  [name], PA-C    Allergies    Bee pollen-1000-royal jelly [nutritional supplements], Penicillins, and Tramadol  Review of Systems   Review of Systems  All other systems reviewed and are negative.   Physical Exam Updated Vital Signs Pulse 83   Temp 98.1 F (36.7 C) (Oral)   Resp (!) 7   Ht 6\' 1"  (1.854 m)   Wt 97.5 kg   SpO2 93%   BMI 28.37 kg/m   Physical Exam Vitals and nursing note reviewed.  Constitutional:      General: He is not in acute distress.    Appearance: Normal appearance. He is well-developed and normal weight.  HENT:     Head: Normocephalic and atraumatic.     Nose: Nose normal.     Mouth/Throat:     Mouth: Mucous membranes are moist.  Eyes:     Conjunctiva/sclera: Conjunctivae normal.     Pupils: Pupils are equal, round, and reactive to light.  Cardiovascular:  Rate and Rhythm: Normal rate and regular rhythm.     Pulses: Normal pulses.     Heart sounds: No murmur heard.   Pulmonary:     Effort: Pulmonary effort is normal. No respiratory distress.     Breath sounds: Normal breath sounds. No wheezing or rales.  Abdominal:     General: There is no distension.     Palpations: Abdomen is soft.     Tenderness: There is no abdominal tenderness. There is no guarding or rebound.  Musculoskeletal:        General: No tenderness. Normal range of motion.     Cervical back: Normal range of motion and neck supple.  Skin:    General: Skin is warm and dry.     Findings: No erythema or rash.  Neurological:     General: No focal deficit present.     Mental Status: He is alert and oriented to person, place, and time. Mental status is at baseline.      Sensory: No sensory deficit.     Motor: No weakness.     Coordination: Coordination normal.     Gait: Gait normal.  Psychiatric:        Mood and Affect: Mood normal.        Behavior: Behavior normal.        Thought Content: Thought content normal.     ED Results / Procedures / Treatments   Labs (all labs ordered are listed, but only abnormal results are displayed) Labs Reviewed  COMPREHENSIVE METABOLIC PANEL - Abnormal; Notable for the following components:      Result Value   Glucose, Bld 108 (*)    AST 55 (*)    ALT 70 (*)    All other components within normal limits  CBC WITH DIFFERENTIAL/PLATELET  LACTIC ACID, PLASMA    EKG EKG Interpretation  Date/Time:  Monday February 08 2020 18:29:09 EDT Ventricular Rate:  79 PR Interval:    QRS Duration: 104 QT Interval:  387 QTC Calculation: 444 R Axis:   65 Text Interpretation: Sinus rhythm No previous tracing Confirmed by Gwyneth Sprout ([id number]) on 02/08/2020 6:46:29 PM   Radiology No results found.  Procedures Procedures (including critical care time)  Medications Ordered in ED Medications  lactated ringers bolus 1,000 mL (1,000 mLs Intravenous New Bag/Given 02/08/20 1951)    ED Course  I have reviewed the triage vital signs and the nursing notes.  Pertinent labs & imaging results that were available during my care of the patient were reviewed by me and considered in my medical decision making (see chart for details).    MDM Rules/Calculators/A&P                          Patient presenting today with near syncopal event at home and most likely dehydration and possible heat exhaustion.  Patient did take a hydrocodone today before his symptoms started and could be a contributing factor.  Patient is otherwise well-appearing at this time.  Mental status is normal and normal neurologic exam.  Will give IV fluids.  Vital signs are reassuring.  Labs are pending.  EKG without acute findings.  11:30 PM Labs wnl.  Pt  feeling better after fluids and headache meds.  No neuro deficits and pt well appearing.  Will d/c home.  MDM Number of Diagnoses or Management Options   Amount and/or Complexity of Data Reviewed Clinical lab tests: ordered and reviewed Tests in the medicine  section of CPT: ordered and reviewed Obtain history from someone other than the patient: yes Discuss the patient with other providers: no Independent visualization of images, tracings, or specimens: yes  Risk of Complications, Morbidity, and/or Mortality Presenting problems: moderate Diagnostic procedures: low Management options: low  Patient Progress Patient progress: improved    Final Clinical Impression(s) / ED Diagnoses Final diagnoses:  Dehydration  Postural dizziness with near syncope    Rx / DC Orders ED Discharge Orders    None       Gwyneth Sprout, MD 02/08/20 [phone]

## 2020-02-08 NOTE — ED Triage Notes (Signed)
Pt bib [facility] due to possible symptoms of an overdose. Pt endorses taking 1 hydrocodone for headache, denies ETOH use, pt dozing off during triage but easily aroused. Pt states he has been outside in the heat all day. Pt is A&O x4, diaphoretic, pale, and cool to the touch.  Pt was on 10L NRB with ems, saturation went from 80's RA to mid 90's with NRB  EMS reports respiratory depression at a rate of 6-8 HR 120's BP 178/110 CBG 104   18G  Left AC placed by ems

## 2020-02-08 NOTE — Discharge Instructions (Signed)
You need to stay home and hydrate tomorrow and stay cool

## 2020-04-08 ENCOUNTER — Encounter (HOSPITAL_COMMUNITY): Payer: Self-pay | Admitting: Emergency Medicine

## 2020-04-08 ENCOUNTER — Other Ambulatory Visit: Payer: Self-pay

## 2020-04-08 ENCOUNTER — Ambulatory Visit (HOSPITAL_COMMUNITY)
Admission: EM | Admit: 2020-04-08 | Discharge: 2020-04-08 | Disposition: A | Discharge status: Home / Self Care | Payer: Self-pay | Attending: Urgent Care | Admitting: Urgent Care

## 2020-04-08 DIAGNOSIS — T148XXA Other injury of unspecified body region, initial encounter: Secondary | ICD-10-CM

## 2020-04-08 DIAGNOSIS — L089 Local infection of the skin and subcutaneous tissue, unspecified: Secondary | ICD-10-CM

## 2020-04-08 DIAGNOSIS — L03011 Cellulitis of right finger: Secondary | ICD-10-CM

## 2020-04-08 DIAGNOSIS — L03113 Cellulitis of right upper limb: Secondary | ICD-10-CM

## 2020-04-08 MED ORDER — NAPROXEN 500 MG PO TABS: 500.0000 mg | ORAL_TABLET | Freq: Two times a day (BID) | ORAL | 0 refills | Status: DC

## 2020-04-08 MED ORDER — DOXYCYCLINE HYCLATE 100 MG PO CAPS: 100.0000 mg | ORAL_CAPSULE | Freq: Two times a day (BID) | ORAL | 0 refills | Status: DC

## 2020-04-08 NOTE — Discharge Instructions (Signed)
Please change your dressing 2-3 times daily using non-stick gauze. Do not apply any ointments or creams. Each time you change your dressing, make sure you clean gently around the perimeter of the wound with gentle soap and warm water. Pat your wound dry and let it air out if possible for 1-2 hours before reapplying another dressing. If your symptoms worsen then please report to the emergency room.

## 2020-04-08 NOTE — ED Provider Notes (Signed)
[facility]   MRN: [id number] DOB: May 01, 1976  Subjective:   Martin Lin is a 44 y.o. male presenting for 3-day history of suffering a right index finger injury from a razor blade.  Patient states that he tried to clean the wound and try to keep it covered.  Unfortunately, the wound has gotten much worse including redness, swelling spreading into his hand.  He has significant tenderness with difficulty bending the index finger.  Denies fever, red streaks along his forearm, nausea, vomiting, belly pain.  No current facility-administered medications for this encounter. No current outpatient medications on file.   Allergies  Allergen Reactions  . Bee Pollen-1000-Royal Jelly [Nutritional Supplements] Shortness Of Breath  . Penicillins Other (See Comments)    Childhood Has patient had a PCN reaction causing immediate rash, facial/tongue/throat swelling, SOB or lightheadedness with hypotension: YES Has patient had a PCN reaction causing severe rash involving mucus membranes or skin necrosis: NO Has patient had a PCN reaction that required hospitalization NO Has patient had a PCN reaction occurring within the last 10 years: NO If all of the above answers are "NO", then may proceed with Cephalosporin use.   . Tramadol Rash    Past Medical History:  Diagnosis Date  . CHF (congestive heart failure) (HCC)      Past Surgical History:  Procedure Laterality Date  . FRACTURE SURGERY      History reviewed. No pertinent family history.  Social History   Tobacco Use  . Smoking status: Never Smoker  . Smokeless tobacco: Never Used  Substance Use Topics  . Alcohol use: Yes  . Drug use: No    ROS   Objective:   Vitals: BP (!) 151/85 (BP Location: Right Arm)   Pulse 85   Temp 98.2 F (36.8 C) (Oral)   Resp 18   SpO2 96%   Physical Exam Constitutional:      General: He is not in acute distress.    Appearance: Normal appearance. He is well-developed and  normal weight. He is not ill-appearing, toxic-appearing or diaphoretic.  HENT:     Head: Normocephalic and atraumatic.     Right Ear: External ear normal.     Left Ear: External ear normal.     Nose: Nose normal.     Mouth/Throat:     Pharynx: Oropharynx is clear.  Eyes:     General: No scleral icterus.       Right eye: No discharge.        Left eye: No discharge.     Extraocular Movements: Extraocular movements intact.     Pupils: Pupils are equal, round, and reactive to light.  Cardiovascular:     Rate and Rhythm: Normal rate.  Pulmonary:     Effort: Pulmonary effort is normal.  Musculoskeletal:       Hands:     Cervical back: Normal range of motion.  Neurological:     Mental Status: He is alert and oriented to person, place, and time.  Psychiatric:        Mood and Affect: Mood normal.        Behavior: Behavior normal.        Thought Content: Thought content normal.        Judgment: Judgment normal.      Assessment and Plan :   PDMP not reviewed this encounter.  1. Infected wound   2. Cellulitis of finger of right hand   3. Cellulitis of right hand  Start doxycycline to address an infected wound.  Reviewed wound care.  Use naproxen for pain and inflammation.  Strict ER precautions. Counseled patient on potential for adverse effects with medications prescribed today, patient verbalized understanding.    Wallis Bamberg, [place] 04/09/20 [phone]

## 2020-04-08 NOTE — ED Triage Notes (Signed)
Pt presents with right pointer finger pain after cutting with razor knife 3 days ago. States continues to swell and has drainage .

## 2020-04-11 ENCOUNTER — Emergency Department (HOSPITAL_COMMUNITY): Payer: Self-pay

## 2020-04-11 ENCOUNTER — Other Ambulatory Visit: Payer: Self-pay

## 2020-04-11 ENCOUNTER — Emergency Department (HOSPITAL_COMMUNITY)
Admission: EM | Admit: 2020-04-11 | Discharge: 2020-04-11 | Disposition: A | Discharge status: Home / Self Care | Payer: Self-pay | Attending: Emergency Medicine | Admitting: Emergency Medicine

## 2020-04-11 ENCOUNTER — Encounter (HOSPITAL_COMMUNITY): Payer: Self-pay | Admitting: Emergency Medicine

## 2020-04-11 DIAGNOSIS — Y990 Civilian activity done for income or pay: Secondary | ICD-10-CM | POA: Insufficient documentation

## 2020-04-11 DIAGNOSIS — S61210A Laceration without foreign body of right index finger without damage to nail, initial encounter: Secondary | ICD-10-CM | POA: Insufficient documentation

## 2020-04-11 DIAGNOSIS — W260XXA Contact with knife, initial encounter: Secondary | ICD-10-CM | POA: Insufficient documentation

## 2020-04-11 DIAGNOSIS — I509 Heart failure, unspecified: Secondary | ICD-10-CM | POA: Insufficient documentation

## 2020-04-11 NOTE — ED Provider Notes (Signed)
[facility] EMERGENCY DEPARTMENT Provider Note   CSN: [id number] Arrival date & time: 04/11/20  1621     History Chief Complaint  Patient presents with  . Finger Injury    Martin Lin is a 44 y.o. male.  The history is provided by the patient.  Hand Pain This is a new problem. The current episode started more than 1 week ago. The problem occurs constantly. The problem has been gradually worsening. Pertinent negatives include no chest pain, no abdominal pain and no shortness of breath. The symptoms are aggravated by bending. Nothing relieves the symptoms. Treatments tried: antibiotics since Saturday.       Past Medical History:  Diagnosis Date  . CHF (congestive heart failure) (HCC)     There are no problems to display for this patient.   Past Surgical History:  Procedure Laterality Date  . FRACTURE SURGERY         History reviewed. No pertinent family history.  Social History   Tobacco Use  . Smoking status: Never Smoker  . Smokeless tobacco: Never Used  Substance Use Topics  . Alcohol use: Yes  . Drug use: No    Home Medications Prior to Admission medications   Medication Sig Start Date End Date Taking? Authorizing Provider  doxycycline (VIBRAMYCIN) 100 MG capsule Take 1 capsule (100 mg total) by mouth 2 (two) times daily. 04/08/20   Wallis Bamberg, PA-C  naproxen (NAPROSYN) 500 MG tablet Take 1 tablet (500 mg total) by mouth 2 (two) times daily with a meal. 04/08/20   Wallis Bamberg, PA-C  diphenhydrAMINE (BENADRYL) 25 MG tablet Take 1 tablet (25 mg total) by mouth every 6 (six) hours. 12/05/17 03/05/19  [name], PA-C    Allergies    Bee pollen-1000-royal jelly [nutritional supplements], Penicillins, and Tramadol  Review of Systems   Review of Systems  Constitutional: Negative for fever.  Respiratory: Negative for shortness of breath.   Cardiovascular: Negative for chest pain and leg swelling.  Gastrointestinal: Negative for  abdominal pain.  Musculoskeletal: Positive for joint swelling.       Finger pain  Skin: Positive for color change.  Neurological: Negative for weakness and numbness.  All other systems reviewed and are negative.   Physical Exam Updated Vital Signs BP (!) 148/66   Pulse 76   Temp 98 F (36.7 C)   Resp 16   Ht 5\' 11"  (1.803 m)   Wt 99.8 kg   SpO2 98%   BMI 30.68 kg/m   Physical Exam Vitals reviewed.  Constitutional:      General: He is not in acute distress.    Appearance: Normal appearance. He is not toxic-appearing.  HENT:     Head: Normocephalic and atraumatic.     Nose: Nose normal.     Mouth/Throat:     Mouth: Mucous membranes are moist.     Pharynx: Oropharynx is clear.  Eyes:     Conjunctiva/sclera: Conjunctivae normal.  Cardiovascular:     Heart sounds: Normal heart sounds.  Pulmonary:     Effort: Pulmonary effort is normal.     Breath sounds: Normal breath sounds.  Abdominal:     General: Abdomen is flat.     Palpations: Abdomen is soft.  Musculoskeletal:        General: Swelling and tenderness present.       Hands:     Cervical back: Neck supple.     Right lower leg: No edema.     Left lower  leg: No edema.     Comments: Laceration on R index finger, with surrounding erythema and swelling, tender and warmth extending into hand  Skin:    General: Skin is warm and dry.     Findings: Erythema and lesion present.  Neurological:     Mental Status: He is alert.  Psychiatric:        Mood and Affect: Mood normal.        Behavior: Behavior normal.    Media Information   Document Information  Photos    04/11/2020 22:10  Attached To:  Hospital Encounter on 04/11/20  Source Information  Mesner, Barbara Cower, MD  [facility]    ED Results / Procedures / Treatments   Labs (all labs ordered are listed, but only abnormal results are displayed) Labs Reviewed - No data to display  EKG None  Radiology DG Hand Complete Right  Result Date:  04/11/2020 CLINICAL DATA:  Laceration to index finger one week ago. Pain and swelling. EXAM: RIGHT HAND - COMPLETE 3+ VIEW COMPARISON:  None. FINDINGS: Remote healed fracture of the fourth proximal phalanx is noted. No acute fractures are identified. No radiopaque foreign bodies. I do not see any obvious gas in the soft tissues of the index finger although there is definitely soft tissue swelling. No plain film findings suspicious for septic arthritis. IMPRESSION: 1. No acute bony findings or radiopaque foreign bodies. 2. Soft tissue swelling but no obvious gas in the soft tissues of the index finger. Electronically Signed   By: Rudie Meyer M.D.   On: 04/11/2020 17:42    Procedures Procedures (including critical care time)  Medications Ordered in ED Medications - No data to display  ED Course  I have reviewed the triage vital signs and the nursing notes.  Pertinent labs & imaging results that were available during my care of the patient were reviewed by me and considered in my medical decision making (see chart for details).    MDM Rules/Calculators/A&P                           Medical Decision Making: Terris Germano is a 44 y.o. male who presented to the ED today with finger injury. Pt reports cutting his R index finger 1 week ago with a knife while at work.  Pt seen in urgent care on Saturday for pain and swelling, given doxycycline for cellulitis concern, advised to come to ED Monday if worsening. Pt reports worsening swelling and draining of pus from cut, pain with ROM.    Past medical history significant for CHF Reviewed and confirmed nursing documentation for past medical history, family history, social history.  On my initial exam, the pt was in NAD, R index finger with swelling and tenderness.   Consults: Hand surgery - reviewed images, comfortable with our plan to dc on abx and they will see pt in clinic tomorrow  All radiology and laboratory studies reviewed independently  and with my attending physician, agree with reading provided by radiologist unless otherwise noted.   Upon reassessing patient, patient was in NAD< pt comfortable with plan for dc and out pt hand follow up.  Based on the above findings, I believe patient is hemodynamically stable for discharge  Pt advised to continue antibiotics as instructed. Given instructions to call hand for apt in am.   Patient/and family educated about specific return precautions for given chief complaint and symptoms.  Patient/and family educated about follow-up with PCP  and hand surgery.  Patient/and family expressed understanding of return precautions and need for follow-up.  Patient discharged.  The above care was discussed with and agreed upon by my attending physician. Emergency Department Medication Summary:  Medications - No data to display     Final Clinical Impression(s) / ED Diagnoses Final diagnoses:  Laceration of right index finger without foreign body without damage to nail, initial encounter    Rx / DC Orders ED Discharge Orders    None       Brantley Fling, MD 04/12/20 Mariann Laster    Marily Memos, MD 04/12/20 [id number]

## 2020-04-11 NOTE — ED Triage Notes (Signed)
Patient arrives to ED from UC with complaints of increased pain, redness, and swelling to right index finger. Cut it last Tuesday with knife while at work. States hes been on antibiotics x2 days bc just received them on Sunday. UC afraid of cellulitis of finger.

## 2020-04-11 NOTE — Discharge Instructions (Addendum)
?  Mellissa Kohut:  Thank you for allowing [place] to take care of you today.  We hope you begin feeling better soon.  To-Do: Please follow-up hand surgery, call the number above tomorrow to schedule an appointment. Continue Doxycycline antibiotics as prescribed. Please call in the morning to be seen in hand surgery clinic tomorrow.  Please take your medications as instructed  Please return to the Emergency Department or call 911 if you experience chest pain, shortness of breath, severe pain, severe fever, altered mental status, or have any reason to think that you need emergency medical care.  Thank you again.  Hope you feel better soon!

## 2020-06-02 ENCOUNTER — Ambulatory Visit: Payer: Self-pay | Attending: Internal Medicine

## 2020-06-02 DIAGNOSIS — Z23 Encounter for immunization: Secondary | ICD-10-CM

## 2020-06-02 NOTE — Progress Notes (Signed)
   Covid-19 Vaccination Clinic  Name:  Martin Lin    MRN: [id number] DOB: 1975/07/04  06/02/2020  [name] was observed post Covid-19 immunization for 15 minutes without incident. He was provided with Vaccine Information Sheet and instruction to access the V-Safe system.   [name] was instructed to call 911 with any severe reactions post vaccine: Marland Kitchen Difficulty breathing  . Swelling of face and throat  . A fast heartbeat  . A bad rash all over body  . Dizziness and weakness   Immunizations Administered    Name Date Dose VIS Date Route   Pfizer COVID-19 Vaccine 06/02/2020  1:22 PM 0.3 mL 04/06/2020 Intramuscular   Manufacturer: [employer], [employer]   Lot: [id number]   NDC: [address]-2

## 2020-06-07 ENCOUNTER — Emergency Department (HOSPITAL_COMMUNITY)
Admission: EM | Admit: 2020-06-07 | Discharge: 2020-06-08 | Disposition: A | Discharge status: Home / Self Care | Payer: Self-pay | Attending: Emergency Medicine | Admitting: Emergency Medicine

## 2020-06-07 ENCOUNTER — Emergency Department (HOSPITAL_COMMUNITY): Payer: Self-pay

## 2020-06-07 ENCOUNTER — Encounter (HOSPITAL_COMMUNITY): Payer: Self-pay

## 2020-06-07 ENCOUNTER — Other Ambulatory Visit: Payer: Self-pay

## 2020-06-07 DIAGNOSIS — I509 Heart failure, unspecified: Secondary | ICD-10-CM | POA: Insufficient documentation

## 2020-06-07 DIAGNOSIS — M25562 Pain in left knee: Secondary | ICD-10-CM | POA: Insufficient documentation

## 2020-06-07 NOTE — ED Triage Notes (Signed)
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

## 2020-06-08 MED ORDER — PREDNISONE 20 MG PO TABS: 40.0000 mg | ORAL_TABLET | Freq: Every day | ORAL | 0 refills | Status: DC

## 2020-06-08 MED ORDER — HYDROCODONE-ACETAMINOPHEN 5-325 MG PO TABS: 1.0000 | ORAL_TABLET | Freq: Four times a day (QID) | ORAL | 0 refills | Status: DC | PRN

## 2020-06-08 MED ORDER — HYDROCODONE-ACETAMINOPHEN 5-325 MG PO TABS
1.0000 | ORAL_TABLET | Freq: Four times a day (QID) | ORAL | 0 refills | Status: DC | PRN
Start: 2020-06-08 — End: 2021-07-17

## 2020-06-08 NOTE — ED Provider Notes (Signed)
[facility] EMERGENCY DEPARTMENT Provider Note   CSN: [id number] Arrival date & time: 06/07/20  1703     History No chief complaint on file.   Martin Lin is a 44 y.o. male.  HPI Patient presents with left knee pain.  States that he had had it for days but now has been 5 days since the wait in the ER.  Had around an 18-hour wait before being able to be seen by me.  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  States that it is worse on the medial side of the knee.  Worse with movement.  Worse with certain positions.  States he is use Motrin and Tylenol without any consistent relief of the pain.  No fevers or chills.  No specific injury to it.  No chest pain or trouble breathing.    Past Medical History:  Diagnosis Date  . CHF (congestive heart failure) (HCC)     There are no problems to display for this patient.   Past Surgical History:  Procedure Laterality Date  . FRACTURE SURGERY         No family history on file.  Social History   Tobacco Use  . Smoking status: Never Smoker  . Smokeless tobacco: Never Used  Substance Use Topics  . Alcohol use: Yes  . Drug use: No    Home Medications Prior to Admission medications   Medication Sig Start Date End Date Taking? Authorizing Provider  doxycycline (VIBRAMYCIN) 100 MG capsule Take 1 capsule (100 mg total) by mouth 2 (two) times daily. 04/08/20   Wallis Bamberg, PA-C  HYDROcodone-acetaminophen (NORCO/VICODIN) 5-325 MG tablet Take 1-2 tablets by mouth every 6 (six) hours as needed for moderate pain. 06/08/20   Benjiman Core, MD  naproxen (NAPROSYN) 500 MG tablet Take 1 tablet (500 mg total) by mouth 2 (two) times daily with a meal. 04/08/20   Wallis Bamberg, PA-C  predniSONE (DELTASONE) 20 MG tablet Take 2 tablets (40 mg total) by mouth daily. 06/08/20   Benjiman Core, MD  diphenhydrAMINE (BENADRYL) 25 MG tablet Take 1 tablet (25 mg  total) by mouth every 6 (six) hours. 12/05/17 03/05/19  [name], PA-C    Allergies    Bee pollen-1000-royal jelly [nutritional supplements], Penicillins, and Tramadol  Review of Systems   Review of Systems  Constitutional: Negative for fever.  Respiratory: Negative for shortness of breath.   Cardiovascular: Negative for chest pain and leg swelling.  Musculoskeletal:       Left knee pain.  Skin: Negative for wound.  Neurological: Negative for weakness and numbness.  Hematological: Negative for adenopathy.    Physical Exam Updated Vital Signs BP (!) 129/93   Pulse 76   Temp 98.6 F (37 C) (Oral)   Resp 16   Ht 5\' 9"  (1.753 m)   Wt 95.3 kg   SpO2 99%   BMI 31.01 kg/m   Physical Exam Vitals and nursing note reviewed.  Cardiovascular:     Rate and Rhythm: Regular rhythm.  Pulmonary:     Breath sounds: No wheezing or rhonchi.  Abdominal:     Tenderness: There is no abdominal tenderness.  Musculoskeletal:     Comments: Tenderness over left knee medially.  No effusion.  Some pain with range of motion.  Decreased range of motion due to pain.  Pain with varus and valgus strain.  Knee stable.  No peripheral edema.  Neurovascular intact in left foot.  Neurological:     Mental Status: He is alert and oriented to person, place, and time.     ED Results / Procedures / Treatments   Labs (all labs ordered are listed, but only abnormal results are displayed) Labs Reviewed - No data to display  EKG None  Radiology DG Knee Complete 4 Views Left  Result Date: 06/07/2020 CLINICAL DATA:  Left knee pain and swelling for 3 days EXAM: LEFT KNEE - COMPLETE 4+ VIEW COMPARISON:  None. FINDINGS: Frontal, bilateral oblique, lateral views of the left knee are obtained. No fracture, subluxation, or dislocation. Joint spaces are well preserved. No joint effusion. IMPRESSION: 1. Unremarkable left knee. Electronically Signed   By: Sharlet Salina M.D.   On: 06/07/2020 18:55     Procedures Procedures (including critical care time)  Medications Ordered in ED Medications - No data to display  ED Course  I have reviewed the triage vital signs and the nursing notes.  Pertinent labs & imaging results that were available during my care of the patient were reviewed by me and considered in my medical decision making (see chart for details).    MDM Rules/Calculators/A&P                          Patient with left knee pain.  X-ray reassuring.  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  Will treat symptomatically.  Will give some steroids and pain medicine.  Knee immobilizer also given with orthopedic follow-up.  Doubt DVT.  Doubt acute fracture.  Doubt gout or intra-arterial infection.  Discharge home. Final Clinical Impression(s) / ED Diagnoses Final diagnoses:  Acute pain of left knee    Rx / DC Orders ED Discharge Orders         Ordered    predniSONE (DELTASONE) 20 MG tablet  Daily        06/08/20 1046    HYDROcodone-acetaminophen (NORCO/VICODIN) 5-325 MG tablet  Every 6 hours PRN        06/08/20 1046           Benjiman Core, MD 06/08/20 1134

## 2020-06-08 NOTE — Progress Notes (Signed)
Orthopedic Tech Progress Note Patient Details:  Martin Lin 1976/04/27 [id number]  Ortho Devices Type of Ortho Device: Knee Immobilizer Ortho Device/Splint Location: LLE Ortho Device/Splint Interventions: Ordered,Application   Post Interventions Patient Tolerated: Well,Ambulated well Instructions Provided: Care of device,Poper ambulation with device   [name] 06/08/2020, 11:22 AM

## 2020-06-08 NOTE — ED Notes (Signed)
Dr. Rubin Payor in triage to assess patient.

## 2020-06-08 NOTE — ED Notes (Signed)
Patient verbalized understanding of dc instructions, vss, ambulatory with nad.   

## 2020-11-10 ENCOUNTER — Other Ambulatory Visit: Payer: Self-pay

## 2020-11-10 ENCOUNTER — Encounter (HOSPITAL_COMMUNITY): Payer: Self-pay

## 2020-11-10 ENCOUNTER — Ambulatory Visit (HOSPITAL_COMMUNITY)
Admission: EM | Admit: 2020-11-10 | Discharge: 2020-11-10 | Disposition: A | Discharge status: Home / Self Care | Payer: Self-pay | Attending: Family Medicine | Admitting: Family Medicine

## 2020-11-10 DIAGNOSIS — Z20822 Contact with and (suspected) exposure to covid-19: Secondary | ICD-10-CM | POA: Insufficient documentation

## 2020-11-10 DIAGNOSIS — J069 Acute upper respiratory infection, unspecified: Secondary | ICD-10-CM | POA: Insufficient documentation

## 2020-11-10 MED ORDER — PREDNISONE 20 MG PO TABS: 40.0000 mg | ORAL_TABLET | Freq: Every day | ORAL | 0 refills | Status: AC

## 2020-11-10 MED ORDER — PROMETHAZINE-DM 6.25-15 MG/5ML PO SYRP: 5.0000 mL | ORAL_SOLUTION | Freq: Three times a day (TID) | ORAL | 0 refills | Status: DC | PRN

## 2020-11-10 NOTE — ED Triage Notes (Signed)
Pt c/o sore throat, emesis, weakness and generalized body aches X 3 weeks.

## 2020-11-10 NOTE — ED Provider Notes (Signed)
[facility]    CSN: [id number] Arrival date & time: 11/10/20  1445      History   Chief Complaint Chief Complaint  Patient presents with  . Sore Throat  . Emesis  . Generalized Body Aches  . Weakness    HPI Martin Lin is a 45 y.o. male.   HPI  Patient in today for evaluation of URI symptoms that have progressively worsened over the course of 3 weeks.  He reports sore throat, nasal congestion, sinus pressure, generalized weakness and body aches.  He denies any shortness of breath or wheezing.  He has been taking over-the-counter NyQuil and taken Tylenol without relief.  He has not had fever.  He had a home COVID test done yesterday which she reports was negative.  Past Medical History:  Diagnosis Date  . CHF (congestive heart failure) (HCC)     There are no problems to display for this patient.   Past Surgical History:  Procedure Laterality Date  . FRACTURE SURGERY         Home Medications    Prior to Admission medications   Medication Sig Start Date End Date Taking? Authorizing Provider  doxycycline (VIBRAMYCIN) 100 MG capsule Take 1 capsule (100 mg total) by mouth 2 (two) times daily. 04/08/20   Wallis Bamberg, PA-C  HYDROcodone-acetaminophen (NORCO/VICODIN) 5-325 MG tablet Take 1-2 tablets by mouth every 6 (six) hours as needed for moderate pain. 06/08/20   Benjiman Core, MD  naproxen (NAPROSYN) 500 MG tablet Take 1 tablet (500 mg total) by mouth 2 (two) times daily with a meal. 04/08/20   Wallis Bamberg, PA-C  predniSONE (DELTASONE) 20 MG tablet Take 2 tablets (40 mg total) by mouth daily. 06/08/20   Benjiman Core, MD  diphenhydrAMINE (BENADRYL) 25 MG tablet Take 1 tablet (25 mg total) by mouth every 6 (six) hours. 12/05/17 03/05/19  [name], PA-C    Family History History reviewed. No pertinent family history.  Social History Social History   Tobacco Use  . Smoking status: Never Smoker  . Smokeless tobacco: Never Used  Substance Use  Topics  . Alcohol use: Yes  . Drug use: No     Allergies   Bee pollen-1000-royal jelly [nutritional supplements], Penicillins, and Tramadol   Review of Systems Review of Systems Pertinent negatives listed in HPI   Physical Exam Triage Vital Signs ED Triage Vitals [11/10/20 1516]  Enc Vitals Group     BP 127/85     Pulse Rate 61     Resp 18     Temp 98.1 F (36.7 C)     Temp Source Oral     SpO2 96 %     Weight      Height      Head Circumference      Peak Flow      Pain Score 0     Pain Loc      Pain Edu?      Excl. in GC?    No data found.  Updated Vital Signs BP 127/85 (BP Location: Left Arm)   Pulse 61   Temp 98.1 F (36.7 C) (Oral)   Resp 18   SpO2 96%   Visual Acuity Right Eye Distance:   Left Eye Distance:   Bilateral Distance:    Right Eye Near:   Left Eye Near:    Bilateral Near:     Physical Exam  General Appearance:    Alert, cooperative, no distress  HENT:   Normocephalic,  ears normal, nares mucosal edema with congestion, rhinorrhea, oropharynx mild erythema present  Eyes:    PERRL, conjunctiva/corneas clear, EOM's intact       Lungs:     Clear to auscultation bilaterally, respirations unlabored  Heart:    Regular rate and rhythm  Neurologic:   Awake, alert, oriented x 3. No apparent focal neurological           defect.      UC Treatments / Results  Labs (all labs ordered are listed, but only abnormal results are displayed) Labs Reviewed - No data to display  EKG   Radiology No results found.  Procedures Procedures (including critical care time)  Medications Ordered in UC Medications - No data to display  Initial Impression / Assessment and Plan / UC Course  I have reviewed the triage vital signs and the nursing notes.  Pertinent labs & imaging results that were available during my care of the patient were reviewed by me and considered in my medical decision making (see chart for details).    Viral upper respiratory  illness however cannot rule out active COVID-19 infection therefore COVID test is pending.  Treatment per discharge instructions.  Red flag symptoms discussed warranting further evaluation.  Continue management of body aches with Tylenol and ibuprofen.  Return precautions if symptoms do not improve or resolve. Final Clinical Impressions(s) / UC Diagnoses   Final diagnoses:  Viral upper respiratory tract infection  Encounter for laboratory testing for COVID-19 virus     Discharge Instructions     Your COVID 19 results will be available in 24-48 hours. Negative results are immediately resulted to Mychart. Positive results will receive a follow-up call from our clinic. Take prescribed medication as directed.  Hydrate well with fluids.  Continue Tylenol or ibuprofen as needed for generalized body aches.    ED Prescriptions    Medication Sig Dispense Auth. Provider   predniSONE (DELTASONE) 20 MG tablet Take 2 tablets (40 mg total) by mouth daily with breakfast for 5 days. 10 tablet Bing Neighbors, FNP   promethazine-dextromethorphan (PROMETHAZINE-DM) 6.25-15 MG/5ML syrup Take 5 mLs by mouth 3 (three) times daily as needed for cough. 140 mL Bing Neighbors, FNP     PDMP not reviewed this encounter.   Bing Neighbors, FNP 11/10/20 [phone]

## 2020-11-10 NOTE — Discharge Instructions (Signed)
Your COVID 19 results will be available in 24-48 hours. Negative results are immediately resulted to Mychart. Positive results will receive a follow-up call from our clinic. Take prescribed medication as directed.  Hydrate well with fluids.  Continue Tylenol or ibuprofen as needed for generalized body aches.

## 2020-11-11 LAB — SARS CORONAVIRUS 2 (TAT 6-24 HRS): SARS Coronavirus 2: NEGATIVE

## 2021-03-19 ENCOUNTER — Ambulatory Visit (HOSPITAL_COMMUNITY)
Admission: EM | Admit: 2021-03-19 | Discharge: 2021-03-19 | Disposition: A | Discharge status: Home / Self Care | Payer: 59 | Attending: Emergency Medicine | Admitting: Emergency Medicine

## 2021-03-19 ENCOUNTER — Other Ambulatory Visit: Payer: Self-pay

## 2021-03-19 ENCOUNTER — Encounter (HOSPITAL_COMMUNITY): Payer: Self-pay | Admitting: Emergency Medicine

## 2021-03-19 DIAGNOSIS — J209 Acute bronchitis, unspecified: Secondary | ICD-10-CM | POA: Diagnosis not present

## 2021-03-19 DIAGNOSIS — R0789 Other chest pain: Secondary | ICD-10-CM | POA: Diagnosis not present

## 2021-03-19 MED ORDER — PREDNISONE 20 MG PO TABS: 40.0000 mg | ORAL_TABLET | Freq: Every day | ORAL | 0 refills | Status: AC

## 2021-03-19 NOTE — ED Triage Notes (Signed)
Pt presents with pain in chest when taking deep breaths. 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

## 2021-03-19 NOTE — ED Provider Notes (Signed)
[facility]    CSN: [id number] Arrival date & time: 03/19/21  1713      History   Chief Complaint Chief Complaint  Patient presents with   Chest Pain    HPI Martin Lin is a 45 y.o. male.   Patient here for evaluation of chest pain on deep inhalation that has been ongoing for the past several days.  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  Reports that he was not wearing a mask or respirator while working.  Reports symptoms have gotten slightly better.  Has not taken any OTC medications or treatments.  Denies any fevers, congestion, shortness of breath, or sore throat.  Denies any N/V/D, numbness, tingling, weakness, abdominal pain, or headaches.    The history is provided by the patient.  Chest Pain  Past Medical History:  Diagnosis Date   CHF (congestive heart failure) (HCC)     There are no problems to display for this patient.   Past Surgical History:  Procedure Laterality Date   FRACTURE SURGERY         Home Medications    Prior to Admission medications   Medication Sig Start Date End Date Taking? Authorizing Provider  predniSONE (DELTASONE) 20 MG tablet Take 2 tablets (40 mg total) by mouth daily for 5 days. 03/19/21 03/24/21 Yes Ivette Loyal, NP  doxycycline (VIBRAMYCIN) 100 MG capsule Take 1 capsule (100 mg total) by mouth 2 (two) times daily. 04/08/20   Wallis Bamberg, PA-C  HYDROcodone-acetaminophen (NORCO/VICODIN) 5-325 MG tablet Take 1-2 tablets by mouth every 6 (six) hours as needed for moderate pain. 06/08/20   Benjiman Core, MD  naproxen (NAPROSYN) 500 MG tablet Take 1 tablet (500 mg total) by mouth 2 (two) times daily with a meal. 04/08/20   Wallis Bamberg, PA-C  promethazine-dextromethorphan (PROMETHAZINE-DM) 6.25-15 MG/5ML syrup Take 5 mLs by mouth 3 (three) times daily as needed for cough. 11/10/20   Bing Neighbors, FNP  diphenhydrAMINE (BENADRYL) 25 MG tablet Take 1 tablet (25 mg total) by  mouth every 6 (six) hours. 12/05/17 03/05/19  [name], PA-C    Family History History reviewed. No pertinent family history.  Social History Social History   Tobacco Use   Smoking status: Never   Smokeless tobacco: Never  Substance Use Topics   Alcohol use: Yes   Drug use: No     Allergies   Bee pollen-1000-royal jelly [nutritional supplements], Penicillins, and Tramadol   Review of Systems Review of Systems  Respiratory:  Positive for chest tightness.   Cardiovascular:  Positive for chest pain.  All other systems reviewed and are negative.   Physical Exam Triage Vital Signs ED Triage Vitals  Enc Vitals Group     BP 03/19/21 1758 132/80     Pulse Rate 03/19/21 1758 84     Resp 03/19/21 1758 18     Temp 03/19/21 1758 98.4 F (36.9 C)     Temp Source 03/19/21 1758 Oral     SpO2 03/19/21 1758 98 %     Weight --      Height --      Head Circumference --      Peak Flow --      Pain Score 03/19/21 1755 0     Pain Loc --      Pain Edu? --      Excl. in GC? --    No data found.  Updated Vital Signs BP 132/80 (BP Location: Left  Arm)   Pulse 84   Temp 98.4 F (36.9 C) (Oral)   Resp 18   SpO2 98%   Visual Acuity Right Eye Distance:   Left Eye Distance:   Bilateral Distance:    Right Eye Near:   Left Eye Near:    Bilateral Near:     Physical Exam Vitals and nursing note reviewed.  Constitutional:      General: He is not in acute distress.    Appearance: Normal appearance. He is not ill-appearing, toxic-appearing or diaphoretic.  HENT:     Head: Normocephalic and atraumatic.  Eyes:     Conjunctiva/sclera: Conjunctivae normal.  Cardiovascular:     Rate and Rhythm: Normal rate and regular rhythm.     Pulses: Normal pulses.     Heart sounds: Normal heart sounds.  Pulmonary:     Effort: Pulmonary effort is normal.     Breath sounds: Normal breath sounds. No decreased breath sounds or wheezing.  Chest:     Chest wall: No mass, deformity,  tenderness, crepitus or edema. There is no dullness to percussion.  Abdominal:     General: Abdomen is flat.     Palpations: Abdomen is soft.  Musculoskeletal:        General: Normal range of motion.     Cervical back: Normal range of motion.  Skin:    General: Skin is warm and dry.  Neurological:     General: No focal deficit present.     Mental Status: He is alert and oriented to person, place, and time.  Psychiatric:        Mood and Affect: Mood normal.     UC Treatments / Results  Labs (all labs ordered are listed, but only abnormal results are displayed) Labs Reviewed - No data to display  EKG   Radiology No results found.  Procedures Procedures (including critical care time)  Medications Ordered in UC Medications - No data to display  Initial Impression / Assessment and Plan / UC Course  I have reviewed the triage vital signs and the nursing notes.  Pertinent labs & imaging results that were available during my care of the patient were reviewed by me and considered in my medical decision making (see chart for details).    Assessment negative for red flags or concerns.  Likely acute bronchitis or atypical chest pain due to inhalation.  We will treat with prednisone daily for the next 5 days.  May take Tylenol as needed.  Encourage fluids.  Follow-up for reevaluation if symptoms do not improve or get worse over the next few days. Final Clinical Impressions(s) / UC Diagnoses   Final diagnoses:  Atypical chest pain  Acute bronchitis, unspecified organism     Discharge Instructions      Take the prednisone daily for the next 5 days.  Take this in the morning.  You can take Tylenol as needed for pain and fevers.  Make sure you are drinking plenty of fluids, especially water.  If your symptoms get worse or do not improve in the next few days please follow-up or go to your primary care for reevaluation.     ED Prescriptions     Medication Sig Dispense  Auth. Provider   predniSONE (DELTASONE) 20 MG tablet Take 2 tablets (40 mg total) by mouth daily for 5 days. 10 tablet Ivette Loyal, NP      PDMP not reviewed this encounter.   Ivette Loyal, NP 03/19/21 1827

## 2021-03-19 NOTE — Discharge Instructions (Signed)
Take the prednisone daily for the next 5 days.  Take this in the morning.  You can take Tylenol as needed for pain and fevers.  Make sure you are drinking plenty of fluids, especially water.  If your symptoms get worse or do not improve in the next few days please follow-up or go to your primary care for reevaluation.

## 2021-03-28 ENCOUNTER — Other Ambulatory Visit: Payer: Self-pay

## 2021-03-28 ENCOUNTER — Ambulatory Visit (HOSPITAL_COMMUNITY)
Admission: EM | Admit: 2021-03-28 | Discharge: 2021-03-28 | Disposition: A | Discharge status: Home / Self Care | Payer: 59 | Attending: Emergency Medicine | Admitting: Emergency Medicine

## 2021-03-28 ENCOUNTER — Encounter (HOSPITAL_COMMUNITY): Payer: Self-pay | Admitting: Emergency Medicine

## 2021-03-28 DIAGNOSIS — K047 Periapical abscess without sinus: Secondary | ICD-10-CM

## 2021-03-28 DIAGNOSIS — K029 Dental caries, unspecified: Secondary | ICD-10-CM | POA: Diagnosis not present

## 2021-03-28 DIAGNOSIS — S025XXA Fracture of tooth (traumatic), initial encounter for closed fracture: Secondary | ICD-10-CM | POA: Diagnosis not present

## 2021-03-28 MED ORDER — CLINDAMYCIN HCL 300 MG PO CAPS: 300.0000 mg | ORAL_CAPSULE | Freq: Three times a day (TID) | ORAL | 0 refills | Status: AC

## 2021-03-28 MED ORDER — HYDROCODONE-ACETAMINOPHEN 5-325 MG PO TABS
1.0000 | ORAL_TABLET | Freq: Once | ORAL | Status: AC
Start: 2021-03-28 — End: 2021-03-28
  Administered 2021-03-28: 1 via ORAL

## 2021-03-28 MED ORDER — HYDROCODONE-ACETAMINOPHEN 5-325 MG PO TABS
ORAL_TABLET | ORAL | Status: AC
  Filled 2021-03-28: qty 1

## 2021-03-28 MED ORDER — LIDOCAINE VISCOUS HCL 2 % MT SOLN: 15.0000 mL | OROMUCOSAL | 0 refills | Status: DC | PRN

## 2021-03-28 NOTE — ED Triage Notes (Signed)
Pt is present with left side dental pain and facial swelling. Pt states the pain started yesterday

## 2021-03-28 NOTE — Discharge Instructions (Signed)
Take the clindamycin 1 pill three times a day for the next 7 days.   You can take Ibuprofen and/or Tylenol as needed for pain relief and fever reduction.  You may also use viscous lidocaine for pain relief.  Stick with a soft diet and maintain oral hygiene care.   Follow up with dentist as soon as possible for further evaluation and treatment  Return or go to the ED if you have any new or worsening symptoms such as fever, chills, difficulty swallowing, painful swallowing, oral or neck swelling, nausea, vomiting, chest pain, SOB.

## 2021-03-28 NOTE — ED Provider Notes (Signed)
[facility]    CSN: [id number] Arrival date & time: 03/28/21  1150      History   Chief Complaint Chief Complaint  Patient presents with   Dental Pain    HPI Martin Lin is a 45 y.o. male.   Patient here for evaluation of left-sided dental pain and facial swelling that started yesterday.  Reports breaking tooth off several days ago but did not develop severe pain or swelling until yesterday.  Reports pain has gotten progressively worse and was unable to sleep last night due to pain.  Reports taking Tylenol as well as Orajel with minimal symptom relief.  Patient has not been seen by a dentist recently.  Denies any fevers, chest pain, shortness of breath, N/V/D, numbness, tingling, weakness, abdominal pain, or headaches.     The history is provided by the patient.  Dental Pain  Past Medical History:  Diagnosis Date   CHF (congestive heart failure) (HCC)     There are no problems to display for this patient.   Past Surgical History:  Procedure Laterality Date   FRACTURE SURGERY         Home Medications    Prior to Admission medications   Medication Sig Start Date End Date Taking? Authorizing Provider  clindamycin (CLEOCIN) 300 MG capsule Take 1 capsule (300 mg total) by mouth 3 (three) times daily for 7 days. 03/28/21 04/04/21 Yes Ivette Loyal, NP  lidocaine (XYLOCAINE) 2 % solution Use as directed 15 mLs in the mouth or throat as needed for mouth pain. 03/28/21  Yes Ivette Loyal, NP  doxycycline (VIBRAMYCIN) 100 MG capsule Take 1 capsule (100 mg total) by mouth 2 (two) times daily. 04/08/20   Wallis Bamberg, PA-C  HYDROcodone-acetaminophen (NORCO/VICODIN) 5-325 MG tablet Take 1-2 tablets by mouth every 6 (six) hours as needed for moderate pain. 06/08/20   Benjiman Core, MD  naproxen (NAPROSYN) 500 MG tablet Take 1 tablet (500 mg total) by mouth 2 (two) times daily with a meal. 04/08/20   Wallis Bamberg, PA-C  promethazine-dextromethorphan  (PROMETHAZINE-DM) 6.25-15 MG/5ML syrup Take 5 mLs by mouth 3 (three) times daily as needed for cough. 11/10/20   Bing Neighbors, FNP  diphenhydrAMINE (BENADRYL) 25 MG tablet Take 1 tablet (25 mg total) by mouth every 6 (six) hours. 12/05/17 03/05/19  [name], PA-C    Family History History reviewed. No pertinent family history.  Social History Social History   Tobacco Use   Smoking status: Never   Smokeless tobacco: Never  Substance Use Topics   Alcohol use: Yes   Drug use: No     Allergies   Bee pollen-1000-royal jelly [nutritional supplements], Penicillins, and Tramadol   Review of Systems Review of Systems  HENT:  Positive for dental problem.   All other systems reviewed and are negative.   Physical Exam Triage Vital Signs ED Triage Vitals  Enc Vitals Group     BP 03/28/21 1256 (!) 160/115     Pulse Rate 03/28/21 1256 77     Resp 03/28/21 1256 18     Temp 03/28/21 1256 98.5 F (36.9 C)     Temp src --      SpO2 03/28/21 1256 97 %     Weight --      Height --      Head Circumference --      Peak Flow --      Pain Score 03/28/21 1255 10     Pain Loc --  Pain Edu? --      Excl. in GC? --    No data found.  Updated Vital Signs BP (!) 160/115   Pulse 77   Temp 98.5 F (36.9 C)   Resp 18   SpO2 97%   Visual Acuity Right Eye Distance:   Left Eye Distance:   Bilateral Distance:    Right Eye Near:   Left Eye Near:    Bilateral Near:     Physical Exam Vitals and nursing note reviewed.  Constitutional:      General: He is not in acute distress.    Appearance: Normal appearance. He is not ill-appearing, toxic-appearing or diaphoretic.  HENT:     Head: Normocephalic and atraumatic.     Mouth/Throat:     Dentition: Abnormal dentition (broken tooth). Dental abscesses present.   Eyes:     Conjunctiva/sclera: Conjunctivae normal.  Cardiovascular:     Rate and Rhythm: Normal rate.     Pulses: Normal pulses.  Pulmonary:     Effort:  Pulmonary effort is normal.  Abdominal:     General: Abdomen is flat.  Musculoskeletal:        General: Normal range of motion.     Cervical back: Normal range of motion.  Skin:    General: Skin is warm and dry.  Neurological:     General: No focal deficit present.     Mental Status: He is alert and oriented to person, place, and time.  Psychiatric:        Mood and Affect: Mood normal.     UC Treatments / Results  Labs (all labs ordered are listed, but only abnormal results are displayed) Labs Reviewed - No data to display  EKG   Radiology No results found.  Procedures Procedures (including critical care time)  Medications Ordered in UC Medications  HYDROcodone-acetaminophen (NORCO/VICODIN) 5-325 MG per tablet 1 tablet (1 tablet Oral Given 03/28/21 1328)    Initial Impression / Assessment and Plan / UC Course  I have reviewed the triage vital signs and the nursing notes.  Pertinent labs & imaging results that were available during my care of the patient were reviewed by me and considered in my medical decision making (see chart for details).    Assessment negative for red flags or concerns.   Clindamycin prescribed as patient is allergic to PCN.  Patient given Norco in office for pain relief.  Wife drove patient to office and will drive patient home.  Recommend Ibuprofen and/or Tylenol as needed.  Prescribed viscous lidocaine for pain relief.  Recommend soft diet until evaluated by dentist Maintain oral hygiene care Follow up with dentist as soon as possible for further evaluation and treatment  Return or go to the ED if you have any new or worsening symptoms such as fever, chills, difficulty swallowing, painful swallowing, oral or neck swelling, nausea, vomiting, chest pain, SOB.  Reviewed expectations re: course of current medical issues. Questions answered. Outlined signs and symptoms indicating need for more acute intervention. Patient verbalized  understanding. After Visit Summary given.  Final Clinical Impressions(s) / UC Diagnoses   Final diagnoses:  Infected dental caries  Closed fracture of tooth, initial encounter     Discharge Instructions      Take the clindamycin 1 pill three times a day for the next 7 days.   You can take Ibuprofen and/or Tylenol as needed for pain relief and fever reduction.  You may also use viscous lidocaine for pain relief.  Stick  with a soft diet and maintain oral hygiene care.   Follow up with dentist as soon as possible for further evaluation and treatment  Return or go to the ED if you have any new or worsening symptoms such as fever, chills, difficulty swallowing, painful swallowing, oral or neck swelling, nausea, vomiting, chest pain, SOB.      ED Prescriptions     Medication Sig Dispense Auth. Provider   clindamycin (CLEOCIN) 300 MG capsule Take 1 capsule (300 mg total) by mouth 3 (three) times daily for 7 days. 21 capsule Chales Salmon R, NP   lidocaine (XYLOCAINE) 2 % solution Use as directed 15 mLs in the mouth or throat as needed for mouth pain. 100 mL Ivette Loyal, NP      PDMP not reviewed this encounter.   Ivette Loyal, NP 03/28/21 1329

## 2021-07-17 ENCOUNTER — Ambulatory Visit (HOSPITAL_COMMUNITY)
Admission: EM | Admit: 2021-07-17 | Discharge: 2021-07-17 | Disposition: A | Discharge status: Home / Self Care | Payer: 59 | Attending: Family Medicine | Admitting: Family Medicine

## 2021-07-17 ENCOUNTER — Encounter (HOSPITAL_COMMUNITY): Payer: Self-pay | Admitting: Emergency Medicine

## 2021-07-17 ENCOUNTER — Other Ambulatory Visit: Payer: Self-pay

## 2021-07-17 DIAGNOSIS — S025XXB Fracture of tooth (traumatic), initial encounter for open fracture: Secondary | ICD-10-CM | POA: Diagnosis not present

## 2021-07-17 DIAGNOSIS — K029 Dental caries, unspecified: Secondary | ICD-10-CM | POA: Diagnosis not present

## 2021-07-17 DIAGNOSIS — K047 Periapical abscess without sinus: Secondary | ICD-10-CM

## 2021-07-17 MED ORDER — CLINDAMYCIN HCL 300 MG PO CAPS: 300.0000 mg | ORAL_CAPSULE | Freq: Four times a day (QID) | ORAL | 0 refills | Status: AC

## 2021-07-17 NOTE — ED Triage Notes (Signed)
Pt reports having dental pain x 4 days. States OTC oragel  doesn't help and believes the tooth is infected.

## 2021-07-17 NOTE — ED Provider Notes (Signed)
[facility]    CSN: [id number] Arrival date & time: 07/17/21  1238      History   Chief Complaint Chief Complaint  Patient presents with   Dental Pain    HPI Martin Lin is a 46 y.o. male.   Patient is here for dental pain/infection the last 5 days.  He has a h/o broken tooth, has been here for this in the past.  He did see a dentist, but still too expensive.  The tooth broke off last week.  In severe pain overall. Orajel not helpful.  Pain is at the upper and lower portion of the teeth, where he is missing teeth.   Past Medical History:  Diagnosis Date   CHF (congestive heart failure) (HCC)     There are no problems to display for this patient.   Past Surgical History:  Procedure Laterality Date   FRACTURE SURGERY         Home Medications    Prior to Admission medications   Medication Sig Start Date End Date Taking? Authorizing Provider  doxycycline (VIBRAMYCIN) 100 MG capsule Take 1 capsule (100 mg total) by mouth 2 (two) times daily. 04/08/20   Wallis Bamberg, PA-C  HYDROcodone-acetaminophen (NORCO/VICODIN) 5-325 MG tablet Take 1-2 tablets by mouth every 6 (six) hours as needed for moderate pain. 06/08/20   Benjiman Core, MD  lidocaine (XYLOCAINE) 2 % solution Use as directed 15 mLs in the mouth or throat as needed for mouth pain. 03/28/21   Ivette Loyal, NP  naproxen (NAPROSYN) 500 MG tablet Take 1 tablet (500 mg total) by mouth 2 (two) times daily with a meal. 04/08/20   Wallis Bamberg, PA-C  promethazine-dextromethorphan (PROMETHAZINE-DM) 6.25-15 MG/5ML syrup Take 5 mLs by mouth 3 (three) times daily as needed for cough. 11/10/20   Bing Neighbors, FNP  diphenhydrAMINE (BENADRYL) 25 MG tablet Take 1 tablet (25 mg total) by mouth every 6 (six) hours. 12/05/17 03/05/19  [name], PA-C    Family History No family history on file.  Social History Social History   Tobacco Use   Smoking status: Never   Smokeless tobacco: Never  Substance  Use Topics   Alcohol use: Yes   Drug use: No     Allergies   Bee pollen-1000-royal jelly [nutritional supplements], Penicillins, and Tramadol   Review of Systems Review of Systems  Constitutional: Negative.   HENT: Negative.    Respiratory: Negative.    Cardiovascular: Negative.   Gastrointestinal: Negative.     Physical Exam Triage Vital Signs ED Triage Vitals  Enc Vitals Group     BP 07/17/21 1353 (!) 142/93     Pulse Rate 07/17/21 1353 63     Resp 07/17/21 1353 16     Temp 07/17/21 1353 98.8 F (37.1 C)     Temp Source 07/17/21 1353 Oral     SpO2 07/17/21 1353 96 %     Weight 07/17/21 1352 210 lb 1.6 oz (95.3 kg)     Height 07/17/21 1352 5\' 9"  (1.753 m)     Head Circumference --      Peak Flow --      Pain Score 07/17/21 1352 10     Pain Loc --      Pain Edu? --      Excl. in GC? --    No data found.  Updated Vital Signs BP (!) 142/93 (BP Location: Left Arm)    Pulse 63    Temp 98.8 F (37.1  C) (Oral)    Resp 16    Ht 5\' 9"  (1.753 m)    Wt 95.3 kg    SpO2 96%    BMI 31.03 kg/m   Visual Acuity Right Eye Distance:   Left Eye Distance:   Bilateral Distance:    Right Eye Near:   Left Eye Near:    Bilateral Near:     Physical Exam Constitutional:      Appearance: Normal appearance.  HENT:     Head:     Comments: Poor dentition.  He has a cracked tooth at the right upper molar;  he has a missing tooth at the right lower molar area;  these areas are tender;  gums are swollen;  Neurological:     Mental Status: He is alert.     UC Treatments / Results  Labs (all labs ordered are listed, but only abnormal results are displayed) Labs Reviewed - No data to display  EKG   Radiology No results found.  Procedures Procedures (including critical care time)  Medications Ordered in UC Medications - No data to display  Initial Impression / Assessment and Plan / UC Course  I have reviewed the triage vital signs and the nursing notes.  Pertinent labs  & imaging results that were available during my care of the patient were reviewed by me and considered in my medical decision making (see chart for details).   Patient was seen today for dental infection.  Clindamycin sent to pharmacy.  He will take otc motrin for pain, with food. He is working on finding another , and will continue to do so to make an appointment.   Final Clinical Impressions(s) / UC Diagnoses   Final diagnoses:  Dental caries  Open fracture of tooth, initial encounter  Dental infection     Discharge Instructions      You were seen today for dental infection.  I have sent out clindamycin to take 4 times/day x 7 days.  You may take motrin 800mg  three times/day with food to avoid upset stomach.   Please try to see another dentist for the dental work you need.     ED Prescriptions     Medication Sig Dispense Auth. Provider   clindamycin (CLEOCIN) 300 MG capsule Take 1 capsule (300 mg total) by mouth every 6 (six) hours for 7 days. 28 capsule Education officer, community, MD      PDMP not reviewed this encounter.   , MD 07/17/21 1431

## 2021-07-17 NOTE — Discharge Instructions (Signed)
You were seen today for dental infection.  I have sent out clindamycin to take 4 times/day x 7 days.  You may take motrin 800mg  three times/day with food to avoid upset stomach.   Please try to see another dentist for the dental work you need.

## 2021-08-20 IMAGING — DX DG HAND COMPLETE 3+V*R*
3 series · 3 of 3 positions shown · non-contrast
Comparison: None.

CLINICAL DATA: Laceration to index finger one week ago. Pain and
swelling.

EXAM:
RIGHT HAND - COMPLETE 3+ VIEW

[x hand obl right]
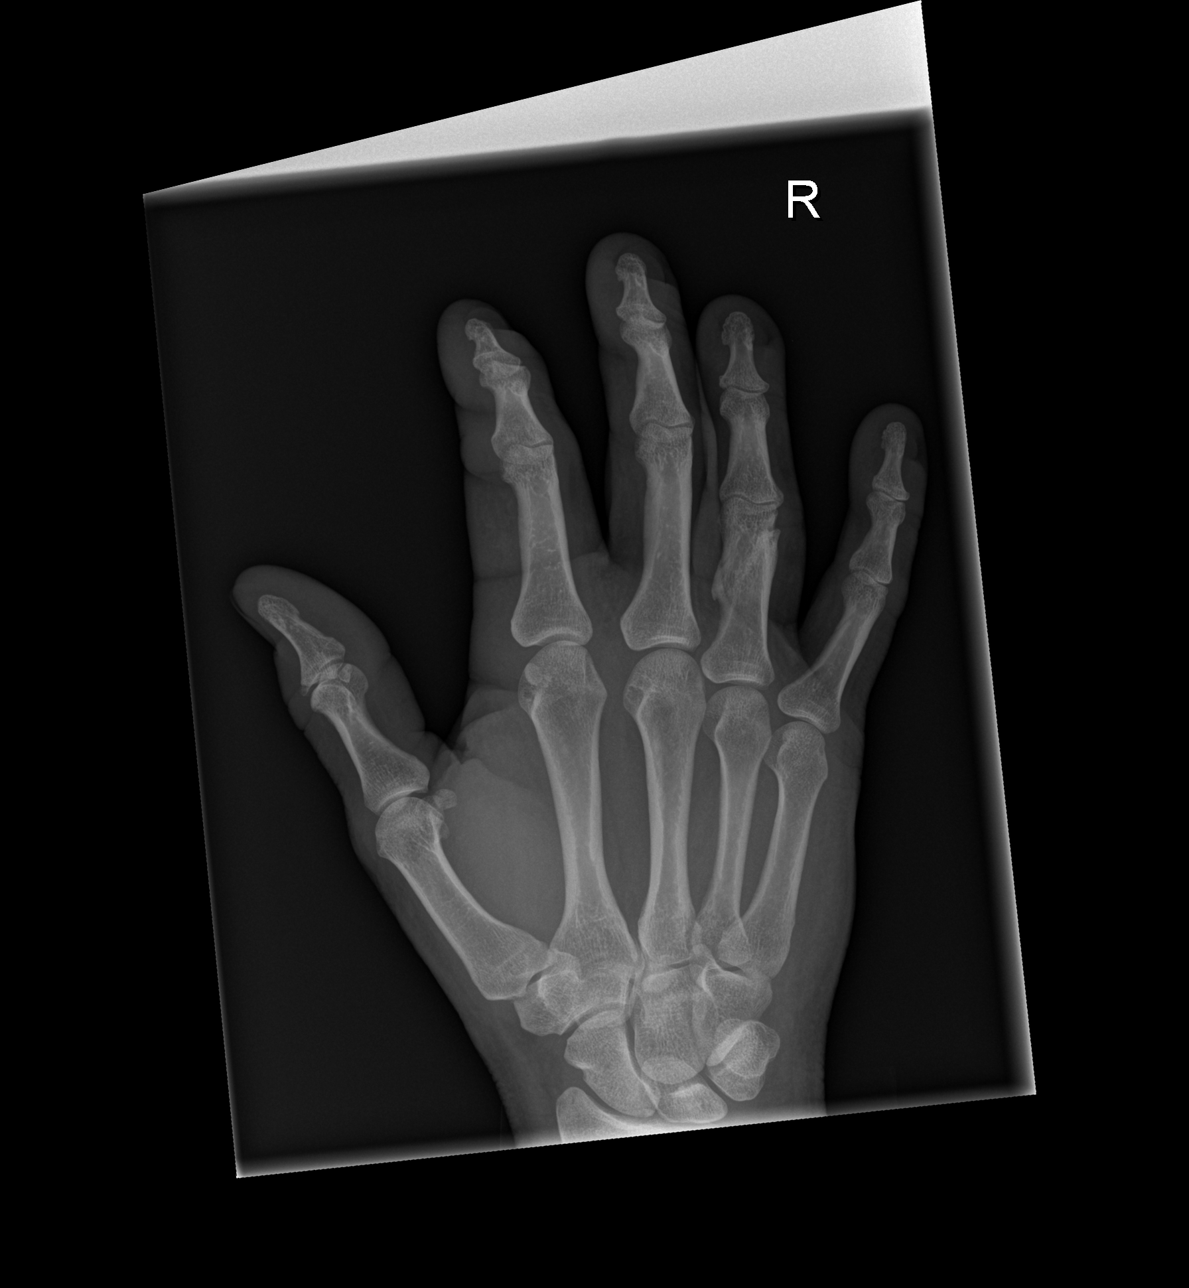

[x hand pa right]
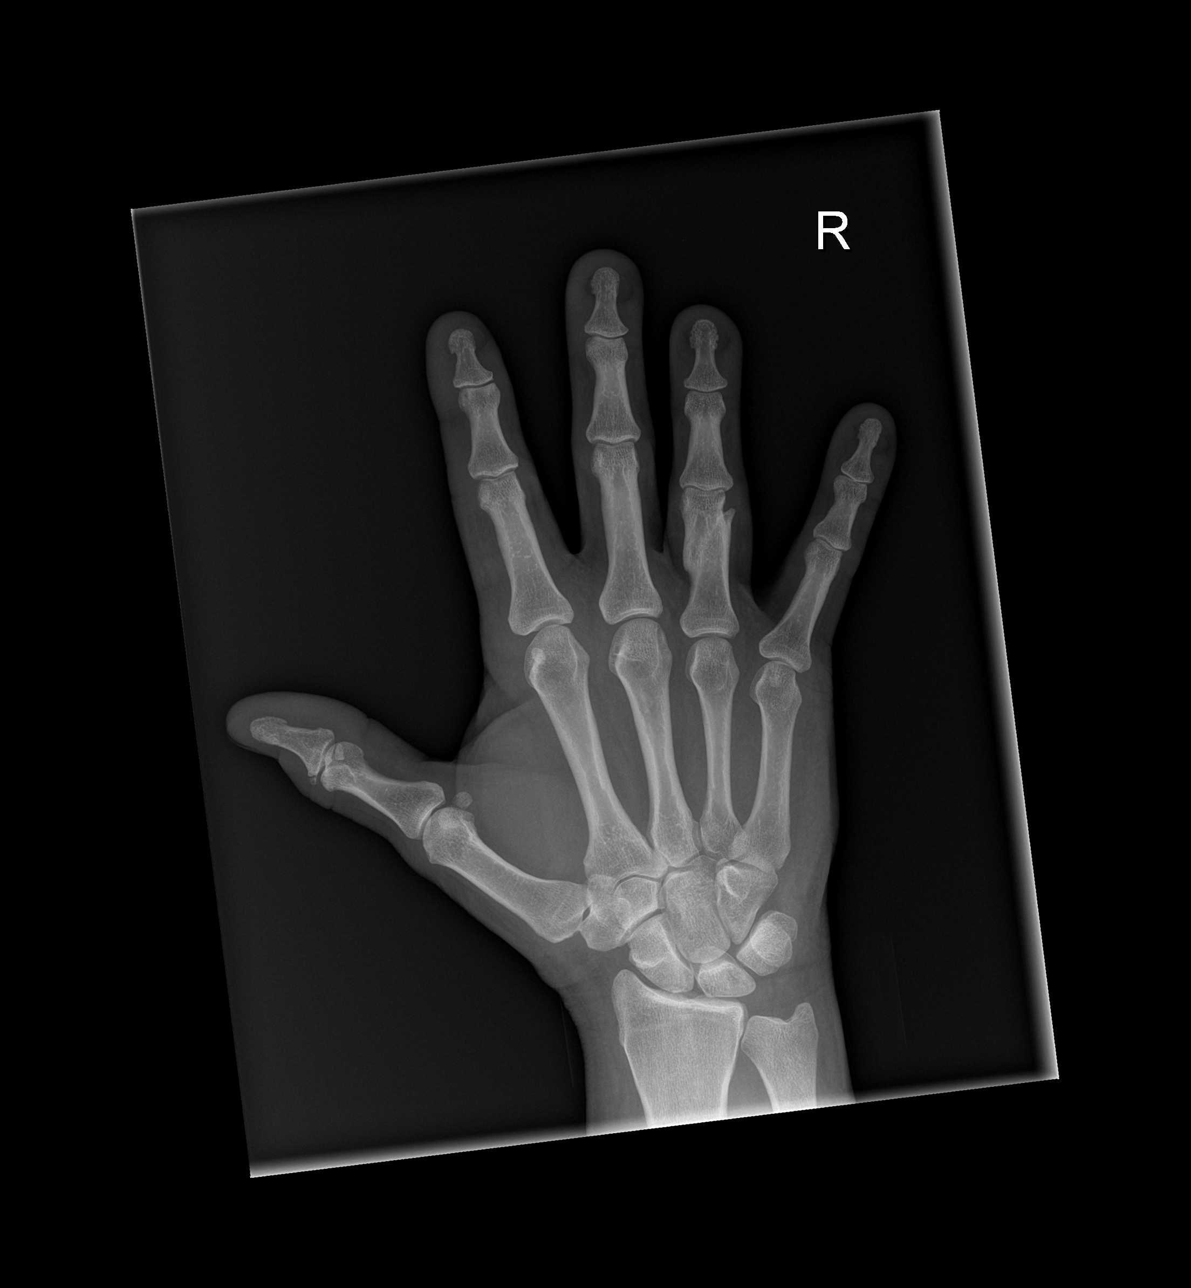

[x hand lat right]
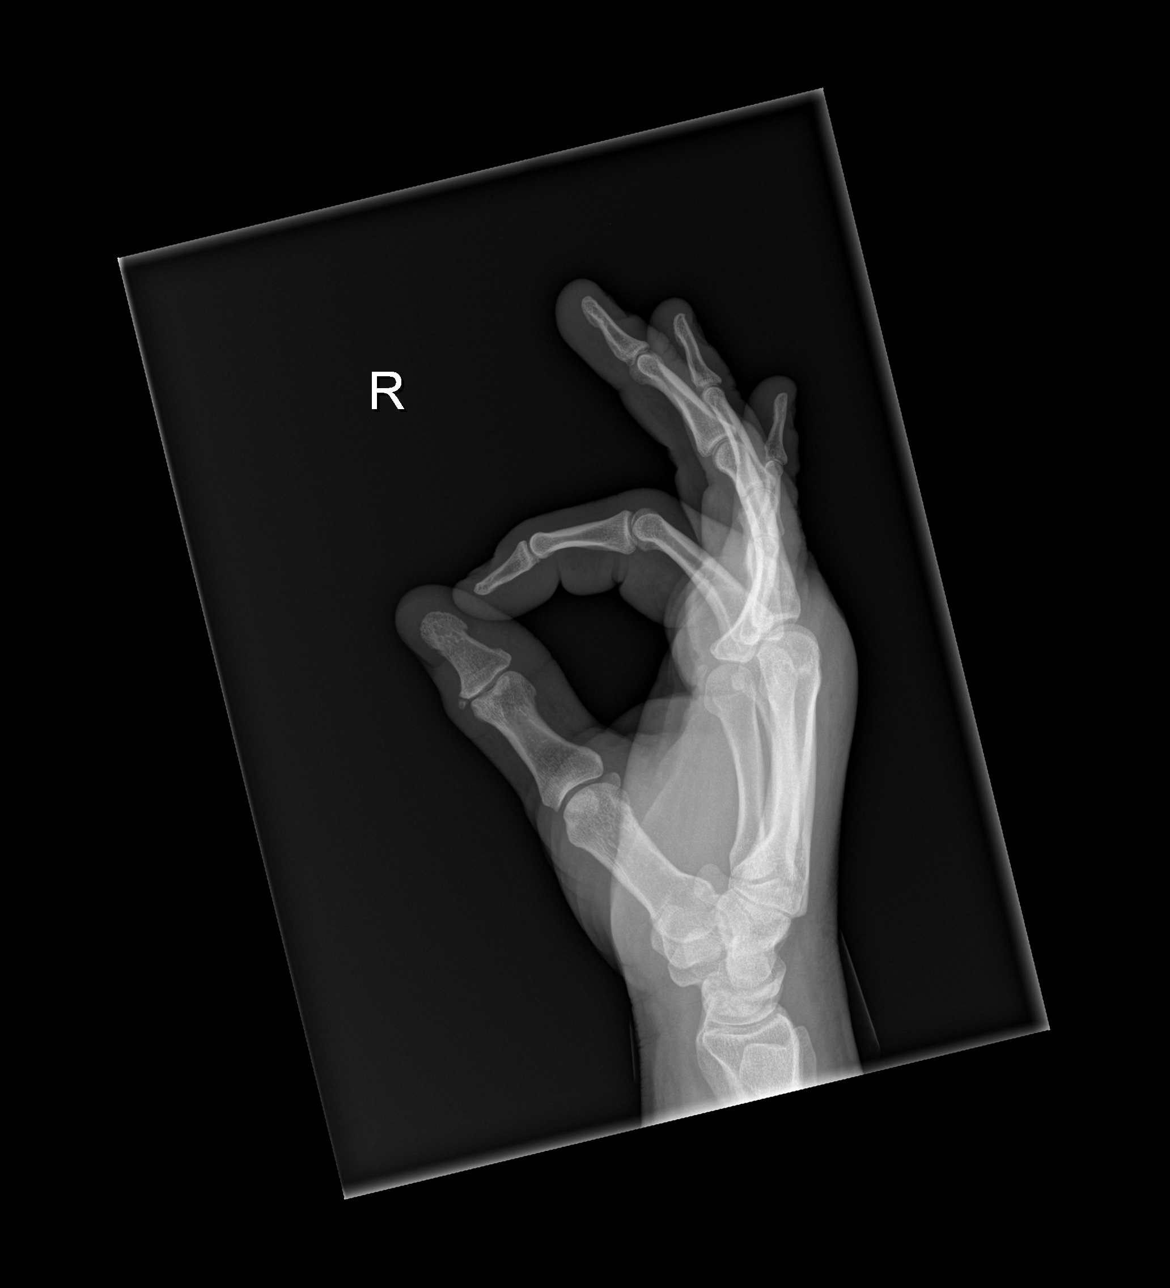

[3 of 3 positions shown; findings below may reference images not displayed]

FINDINGS: Remote healed fracture of the fourth proximal phalanx is noted.

No acute fractures are identified. No radiopaque foreign bodies. I
do not see any obvious gas in the soft tissues of the index finger
although there is definitely soft tissue swelling. No plain film
findings suspicious for septic arthritis.
IMPRESSION: 1. No acute bony findings or radiopaque foreign bodies.
2. Soft tissue swelling but no obvious gas in the soft tissues of
the index finger.

## 2023-02-11 ENCOUNTER — Encounter (HOSPITAL_COMMUNITY): Payer: Self-pay | Admitting: *Deleted

## 2023-02-11 ENCOUNTER — Ambulatory Visit (HOSPITAL_COMMUNITY)
Admission: EM | Admit: 2023-02-11 | Discharge: 2023-02-11 | Disposition: A | Discharge status: Home / Self Care | Payer: 59 | Attending: Internal Medicine | Admitting: Internal Medicine

## 2023-02-11 DIAGNOSIS — K047 Periapical abscess without sinus: Secondary | ICD-10-CM

## 2023-02-11 MED ORDER — HYDROCODONE-ACETAMINOPHEN 5-325 MG PO TABS: 1.0000 | ORAL_TABLET | Freq: Four times a day (QID) | ORAL | 0 refills | Status: AC | PRN

## 2023-02-11 MED ORDER — CLINDAMYCIN HCL 300 MG PO CAPS: 300.0000 mg | ORAL_CAPSULE | Freq: Three times a day (TID) | ORAL | 0 refills | Status: AC

## 2023-02-11 NOTE — ED Triage Notes (Signed)
Pt states the whole left side of his face hurts due to dental pain X 2 weeks. He has used OTC meds and IBU without relief.

## 2023-02-11 NOTE — Discharge Instructions (Signed)
Please take medications as prescribed Follow-up with your dentist for further management Mouth rinse with antiseptic mouthwash as needed Please return to urgent care if symptoms persist or worsens.

## 2023-02-15 NOTE — ED Provider Notes (Signed)
[facility]    CSN: [id number] Arrival date & time: 02/11/23  1709      History   Chief Complaint Chief Complaint  Patient presents with   Dental Pain    HPI Martin Lin is a 47 y.o. male with poor dental hygiene comes to urgent care with a 2-week history of tooth ache with left-sided facial pain.  Patient says symptoms started 2 weeks ago and has been worsening.  Pain is currently of moderate severity, throbbing with no known relieving factors.  It is associated with left-sided facial swelling.  Patient is unable to see a dentist at this time.  No discharge from the gum.  No fever or chills.  No trauma to the face.   HPI  Past Medical History:  Diagnosis Date   CHF (congestive heart failure) (HCC)     There are no problems to display for this patient.   Past Surgical History:  Procedure Laterality Date   FRACTURE SURGERY         Home Medications    Prior to Admission medications   Medication Sig Start Date End Date Taking? Authorizing Provider  clindamycin (CLEOCIN) 300 MG capsule Take 1 capsule (300 mg total) by mouth 3 (three) times daily for 7 days. 02/11/23 02/18/23 Yes Ajani Schnieders, Britta Mccreedy, MD  HYDROcodone-acetaminophen (NORCO/VICODIN) 5-325 MG tablet Take 1 tablet by mouth every 6 (six) hours as needed. 02/11/23  Yes Wolf Boulay, Britta Mccreedy, MD  diphenhydrAMINE (BENADRYL) 25 MG tablet Take 1 tablet (25 mg total) by mouth every 6 (six) hours. 12/05/17 03/05/19  [name], PA-C    Family History History reviewed. No pertinent family history.  Social History Social History   Tobacco Use   Smoking status: Never   Smokeless tobacco: Never  Vaping Use   Vaping status: Never Used  Substance Use Topics   Alcohol use: Yes   Drug use: No     Allergies   Bee pollen-1000-royal jelly [nutritional supplements], Penicillins, and Tramadol   Review of Systems Review of Systems As per HPI  Physical Exam Triage Vital Signs ED Triage Vitals  Encounter  Vitals Group     BP 02/11/23 1846 (!) 144/90     Systolic BP Percentile --      Diastolic BP Percentile --      Pulse Rate 02/11/23 1846 67     Resp 02/11/23 1846 18     Temp 02/11/23 1846 98.3 F (36.8 C)     Temp Source 02/11/23 1846 Oral     SpO2 02/11/23 1846 97 %     Weight --      Height --      Head Circumference --      Peak Flow --      Pain Score 02/11/23 1844 10     Pain Loc --      Pain Education --      Exclude from Growth Chart --    No data found.  Updated Vital Signs BP (!) 144/90 (BP Location: Left Arm)   Pulse 67   Temp 98.3 F (36.8 C) (Oral)   Resp 18   SpO2 97%   Visual Acuity Right Eye Distance:   Left Eye Distance:   Bilateral Distance:    Right Eye Near:   Left Eye Near:    Bilateral Near:     Physical Exam Vitals and nursing note reviewed.  Constitutional:      General: He is in acute distress.  Appearance: He is not ill-appearing.  HENT:     Nose: Nose normal.     Mouth/Throat:     Mouth: Mucous membranes are moist.     Comments: Multiple dental cavities.  Tenderness on palpation of the gum in the left maxillary region.  No erythema over the cheeks. Neurological:     Mental Status: He is alert.      UC Treatments / Results  Labs (all labs ordered are listed, but only abnormal results are displayed) Labs Reviewed - No data to display  EKG   Radiology No results found.  Procedures Procedures (including critical care time)  Medications Ordered in UC Medications - No data to display  Initial Impression / Assessment and Plan / UC Course  I have reviewed the triage vital signs and the nursing notes.  Pertinent labs & imaging results that were available during my care of the patient were reviewed by me and considered in my medical decision making (see chart for details).     1.  Dental infection: Clindamycin 300 mg 3 times daily for 7 days Hydrocodone-acetaminophen as needed for pain Antiseptic mouth rinse Patient  is advised to follow-up with a dentist for dental care Return precautions given. Final Clinical Impressions(s) / UC Diagnoses   Final diagnoses:  Dental infection     Discharge Instructions      Please take medications as prescribed Follow-up with your dentist for further management Mouth rinse with antiseptic mouthwash as needed Please return to urgent care if symptoms persist or worsens.   ED Prescriptions     Medication Sig Dispense Auth. Provider   clindamycin (CLEOCIN) 300 MG capsule Take 1 capsule (300 mg total) by mouth 3 (three) times daily for 7 days. 21 capsule Shameika Speelman, Britta Mccreedy, MD   HYDROcodone-acetaminophen (NORCO/VICODIN) 5-325 MG tablet Take 1 tablet by mouth every 6 (six) hours as needed. 12 tablet Bennett Vanscyoc, Britta Mccreedy, MD      I have reviewed the PDMP during this encounter.   Merrilee Jansky, MD 02/15/23 [phone]

## 2023-06-17 DIAGNOSIS — F1120 Opioid dependence, uncomplicated: Secondary | ICD-10-CM | POA: Diagnosis not present

## 2023-06-17 DIAGNOSIS — F1123 Opioid dependence with withdrawal: Secondary | ICD-10-CM | POA: Diagnosis not present

## 2023-06-20 DIAGNOSIS — F1120 Opioid dependence, uncomplicated: Secondary | ICD-10-CM | POA: Diagnosis not present

## 2023-07-02 DIAGNOSIS — F1120 Opioid dependence, uncomplicated: Secondary | ICD-10-CM | POA: Diagnosis not present

## 2023-07-07 DIAGNOSIS — F1120 Opioid dependence, uncomplicated: Secondary | ICD-10-CM | POA: Diagnosis not present

## 2023-07-15 DIAGNOSIS — F1120 Opioid dependence, uncomplicated: Secondary | ICD-10-CM | POA: Diagnosis not present

## 2023-07-18 DIAGNOSIS — F1120 Opioid dependence, uncomplicated: Secondary | ICD-10-CM | POA: Diagnosis not present

## 2023-07-19 DIAGNOSIS — F1120 Opioid dependence, uncomplicated: Secondary | ICD-10-CM | POA: Diagnosis not present

## 2023-07-31 DIAGNOSIS — F1120 Opioid dependence, uncomplicated: Secondary | ICD-10-CM | POA: Diagnosis not present

## 2023-08-05 DIAGNOSIS — F1120 Opioid dependence, uncomplicated: Secondary | ICD-10-CM | POA: Diagnosis not present

## 2023-08-08 DIAGNOSIS — F1120 Opioid dependence, uncomplicated: Secondary | ICD-10-CM | POA: Diagnosis not present

## 2023-08-11 DIAGNOSIS — F1120 Opioid dependence, uncomplicated: Secondary | ICD-10-CM | POA: Diagnosis not present

## 2023-08-14 DIAGNOSIS — F1120 Opioid dependence, uncomplicated: Secondary | ICD-10-CM | POA: Diagnosis not present

## 2023-08-16 DIAGNOSIS — F1120 Opioid dependence, uncomplicated: Secondary | ICD-10-CM | POA: Diagnosis not present

## 2023-08-28 DIAGNOSIS — F1120 Opioid dependence, uncomplicated: Secondary | ICD-10-CM | POA: Diagnosis not present

## 2023-09-15 DIAGNOSIS — F1120 Opioid dependence, uncomplicated: Secondary | ICD-10-CM | POA: Diagnosis not present

## 2023-09-16 DIAGNOSIS — F1120 Opioid dependence, uncomplicated: Secondary | ICD-10-CM | POA: Diagnosis not present

## 2023-09-18 DIAGNOSIS — F1120 Opioid dependence, uncomplicated: Secondary | ICD-10-CM | POA: Diagnosis not present

## 2023-10-11 DIAGNOSIS — F1120 Opioid dependence, uncomplicated: Secondary | ICD-10-CM | POA: Diagnosis not present

## 2023-10-14 DIAGNOSIS — F1120 Opioid dependence, uncomplicated: Secondary | ICD-10-CM | POA: Diagnosis not present

## 2023-10-16 DIAGNOSIS — F1120 Opioid dependence, uncomplicated: Secondary | ICD-10-CM | POA: Diagnosis not present

## 2023-10-31 DIAGNOSIS — F1120 Opioid dependence, uncomplicated: Secondary | ICD-10-CM | POA: Diagnosis not present

## 2023-11-04 DIAGNOSIS — F1120 Opioid dependence, uncomplicated: Secondary | ICD-10-CM | POA: Diagnosis not present

## 2023-11-16 DIAGNOSIS — F1120 Opioid dependence, uncomplicated: Secondary | ICD-10-CM | POA: Diagnosis not present

## 2023-11-28 DIAGNOSIS — F1120 Opioid dependence, uncomplicated: Secondary | ICD-10-CM | POA: Diagnosis not present

## 2023-12-02 DIAGNOSIS — F1120 Opioid dependence, uncomplicated: Secondary | ICD-10-CM | POA: Diagnosis not present

## 2023-12-16 DIAGNOSIS — F1120 Opioid dependence, uncomplicated: Secondary | ICD-10-CM | POA: Diagnosis not present

## 2024-01-09 DIAGNOSIS — F1120 Opioid dependence, uncomplicated: Secondary | ICD-10-CM | POA: Diagnosis not present

## 2024-01-16 DIAGNOSIS — F1120 Opioid dependence, uncomplicated: Secondary | ICD-10-CM | POA: Diagnosis not present

## 2024-02-10 DIAGNOSIS — F1120 Opioid dependence, uncomplicated: Secondary | ICD-10-CM | POA: Diagnosis not present

## 2024-02-12 DIAGNOSIS — F1120 Opioid dependence, uncomplicated: Secondary | ICD-10-CM | POA: Diagnosis not present

## 2024-02-16 DIAGNOSIS — F1120 Opioid dependence, uncomplicated: Secondary | ICD-10-CM | POA: Diagnosis not present

## 2024-03-03 DIAGNOSIS — F1120 Opioid dependence, uncomplicated: Secondary | ICD-10-CM | POA: Diagnosis not present

## 2024-04-01 DIAGNOSIS — F1120 Opioid dependence, uncomplicated: Secondary | ICD-10-CM | POA: Diagnosis not present

## 2024-04-17 DIAGNOSIS — F1120 Opioid dependence, uncomplicated: Secondary | ICD-10-CM | POA: Diagnosis not present

## 2024-04-30 DIAGNOSIS — F1120 Opioid dependence, uncomplicated: Secondary | ICD-10-CM | POA: Diagnosis not present

## 2024-05-13 DIAGNOSIS — F1120 Opioid dependence, uncomplicated: Secondary | ICD-10-CM | POA: Diagnosis not present

## 2024-05-17 DIAGNOSIS — F1120 Opioid dependence, uncomplicated: Secondary | ICD-10-CM | POA: Diagnosis not present
